# Patient Record
Sex: Female | Born: 1970 | Race: White | Hispanic: No | Marital: Single | Smoking: Never smoker
Health system: Southern US, Community
[De-identification: ages and names within clinical notes are randomized; demographics above are authoritative.]

## PROBLEM LIST (undated history)

## (undated) DIAGNOSIS — E669 Obesity, unspecified: Secondary | ICD-10-CM

## (undated) DIAGNOSIS — B009 Herpesviral infection, unspecified: Secondary | ICD-10-CM

## (undated) DIAGNOSIS — I499 Cardiac arrhythmia, unspecified: Secondary | ICD-10-CM

## (undated) DIAGNOSIS — M545 Low back pain, unspecified: Secondary | ICD-10-CM

## (undated) DIAGNOSIS — L709 Acne, unspecified: Secondary | ICD-10-CM

## (undated) DIAGNOSIS — F430 Acute stress reaction: Secondary | ICD-10-CM

## (undated) DIAGNOSIS — R768 Other specified abnormal immunological findings in serum: Secondary | ICD-10-CM

## (undated) HISTORY — DX: Low back pain: M54.5

## (undated) HISTORY — DX: Low back pain, unspecified: M54.50

## (undated) HISTORY — DX: Herpesviral infection, unspecified: B00.9

## (undated) HISTORY — DX: Acne, unspecified: L70.9

## (undated) HISTORY — DX: Other specified abnormal immunological findings in serum: R76.8

## (undated) HISTORY — DX: Acute stress reaction: F43.0

## (undated) HISTORY — DX: Obesity, unspecified: E66.9

---

## 2000-10-11 ENCOUNTER — Other Ambulatory Visit: Admission: RE | Admit: 2000-10-11 | Discharge: 2000-10-11 | Payer: Self-pay | Admitting: Obstetrics and Gynecology

## 2001-12-25 ENCOUNTER — Other Ambulatory Visit: Admission: RE | Admit: 2001-12-25 | Discharge: 2001-12-25 | Payer: Self-pay | Admitting: Obstetrics and Gynecology

## 2002-12-26 ENCOUNTER — Other Ambulatory Visit: Admission: RE | Admit: 2002-12-26 | Discharge: 2002-12-26 | Payer: Self-pay | Admitting: Obstetrics and Gynecology

## 2002-12-27 ENCOUNTER — Other Ambulatory Visit: Admission: RE | Admit: 2002-12-27 | Discharge: 2002-12-27 | Payer: Self-pay | Admitting: Obstetrics and Gynecology

## 2004-02-13 ENCOUNTER — Other Ambulatory Visit: Admission: RE | Admit: 2004-02-13 | Discharge: 2004-02-13 | Payer: Self-pay | Admitting: Obstetrics and Gynecology

## 2005-03-15 ENCOUNTER — Other Ambulatory Visit: Admission: RE | Admit: 2005-03-15 | Discharge: 2005-03-15 | Payer: Self-pay | Admitting: Obstetrics and Gynecology

## 2008-01-10 ENCOUNTER — Emergency Department (HOSPITAL_COMMUNITY): Admission: EM | Admit: 2008-01-10 | Discharge: 2008-01-10 | Payer: Self-pay | Admitting: Family Medicine

## 2008-06-17 ENCOUNTER — Ambulatory Visit (HOSPITAL_COMMUNITY): Admission: RE | Admit: 2008-06-17 | Discharge: 2008-06-17 | Payer: Self-pay | Admitting: Obstetrics and Gynecology

## 2009-12-29 HISTORY — PX: TONSILLECTOMY: SUR1361

## 2010-01-04 ENCOUNTER — Ambulatory Visit (HOSPITAL_BASED_OUTPATIENT_CLINIC_OR_DEPARTMENT_OTHER): Admission: RE | Admit: 2010-01-04 | Discharge: 2010-01-05 | Payer: Self-pay | Admitting: Otolaryngology

## 2010-07-20 ENCOUNTER — Encounter: Payer: Self-pay | Admitting: Cardiology

## 2010-09-14 ENCOUNTER — Ambulatory Visit: Payer: Self-pay | Admitting: Cardiology

## 2010-09-14 DIAGNOSIS — E663 Overweight: Secondary | ICD-10-CM | POA: Insufficient documentation

## 2010-09-14 DIAGNOSIS — R002 Palpitations: Secondary | ICD-10-CM

## 2010-09-14 DIAGNOSIS — I10 Essential (primary) hypertension: Secondary | ICD-10-CM | POA: Insufficient documentation

## 2010-09-24 ENCOUNTER — Encounter: Payer: Self-pay | Admitting: Cardiology

## 2010-09-24 ENCOUNTER — Ambulatory Visit (HOSPITAL_COMMUNITY): Admission: RE | Admit: 2010-09-24 | Discharge: 2010-09-24 | Payer: Self-pay | Admitting: Cardiology

## 2010-09-24 ENCOUNTER — Ambulatory Visit: Payer: Self-pay | Admitting: Cardiology

## 2010-09-24 ENCOUNTER — Ambulatory Visit: Payer: Self-pay

## 2010-10-13 ENCOUNTER — Telehealth: Payer: Self-pay | Admitting: Cardiology

## 2010-11-02 ENCOUNTER — Encounter: Payer: Self-pay | Admitting: Cardiology

## 2010-11-04 ENCOUNTER — Ambulatory Visit: Payer: Self-pay | Admitting: Cardiology

## 2010-11-04 ENCOUNTER — Encounter: Payer: Self-pay | Admitting: Cardiology

## 2010-11-04 DIAGNOSIS — R0609 Other forms of dyspnea: Secondary | ICD-10-CM

## 2010-11-04 DIAGNOSIS — R9389 Abnormal findings on diagnostic imaging of other specified body structures: Secondary | ICD-10-CM

## 2010-11-04 DIAGNOSIS — R0989 Other specified symptoms and signs involving the circulatory and respiratory systems: Secondary | ICD-10-CM | POA: Insufficient documentation

## 2010-11-16 ENCOUNTER — Ambulatory Visit: Payer: Self-pay | Admitting: Physician Assistant

## 2010-11-16 ENCOUNTER — Ambulatory Visit: Payer: Self-pay

## 2010-12-30 NOTE — Consult Note (Signed)
Summary: Urgent Medical Referral Form   Urgent Medical Referral Form   Imported By: Roderic Ovens 09/30/2010 11:31:36  _____________________________________________________________________  External Attachment:    Type:   Image     Comment:   External Document

## 2010-12-30 NOTE — Procedures (Signed)
Summary: summary report  summary report   Imported By: Mirna Mires 10/15/2010 11:31:32  _____________________________________________________________________  External Attachment:    Type:   Image     Comment:   External Document

## 2010-12-30 NOTE — Miscellaneous (Signed)
  Clinical Lists Changes  Observations: Added new observation of ECHOINTERP: - Left ventricle: The cavity size was normal. Wall thickness was       normal. Systolic function was normal. The estimated ejection       fraction was in the range of 50% to 55%. Wall motion was normal;       there were no regional wall motion abnormalities. Left ventricular       diastolic function parameters were normal.     - Left atrium: The atrium was mildly to moderately dilated.     - Right atrium: The atrium was mildly dilated. (09/24/2010 11:16) Added new observation of HOLTERFIND: NSR Frequent PVC's with bigemeny (09/24/2010 11:15)      Holter Monitor  Procedure date:  09/24/2010  Findings:      NSR Frequent PVC's with bigemeny  Echocardiogram  Procedure date:  09/24/2010  Findings:      - Left ventricle: The cavity size was normal. Wall thickness was       normal. Systolic function was normal. The estimated ejection       fraction was in the range of 50% to 55%. Wall motion was normal;       there were no regional wall motion abnormalities. Left ventricular       diastolic function parameters were normal.     - Left atrium: The atrium was mildly to moderately dilated.     - Right atrium: The atrium was mildly dilated.

## 2010-12-30 NOTE — Progress Notes (Signed)
Summary: pt wants echo results  Phone Note Call from Patient   Caller: Patient [phone]  Reason for Call: Talk to Nurse Summary of Call: pt wants echo results Initial call taken by: Glynda Jaeger,  October 13, 2010 4:33 PM  Follow-up for Phone Call        Phone Call Completed PER PT AWARE HOLTER NSR WITH FREQUENT PVC WITH BIGEMINEY PT ASKING IF OKAY TO GIVE BLOOD BASED A UPON ECHO AND HOLTER RESULTS. THIS WAS REASON FOR TESTING IRREG HEART DENIED ON GIVING BLOOD. Follow-up by: Scherrie Bateman, LPN,  October 13, 2010 4:55 PM     Appended Document: pt wants echo results We have tried to get in touch with the patient multiple times to arrange follow up.

## 2010-12-30 NOTE — Assessment & Plan Note (Signed)
Summary: rov per dr hochrein/pam   Visit Type:  Follow-up Primary Hannah [name]:  C. Leotis Shames, PA-C  CC:  PVCs.  History of Present Illness: The she presents for followup. She was having frequent PVCs and a Holter monitor demonstrated 11% of her beats to be premature ventricular contractions. She actually has not been feeling these recently. She's had no presyncope or syncope. She's had no chest pressure, neck or arm discomfort. His shortness of breath, PND or orthopnea. I did send her for an echocardiogram which demonstrated the EF to be about 50-55% with some mild to moderate left atrial enlargement. There were no valvular abnormalities.   Current Medications (verified): 1)  Citalopram Hydrobromide 20 Mg Tabs (Citalopram Hydrobromide) .Marland Kitchen.. 1 By Mouth Daily 2)  Levora 0.15/30 (28) 0.15-30 Mg-Mcg Tabs (Levonorgestrel-Ethinyl Estrad) .... As Directed  Allergies (verified): 1)  ! Sulfa  Past History:  Past Medical History: Last updated: 09/14/2010 Borderline HTN  Past Surgical History: Reviewed history from 09/14/2010 and no changes required. Tonsilectomy  Review of Systems       Snoring, dry mouth. Otherwise as stated in the history of present illness negative for all other systems.  Vital Signs:  Patient profile:   40 year old female Height:      68 inches Weight:      247 pounds BMI:     37.69 Pulse rate:   68 / minute Resp:     16 per minute BP sitting:   122 / 78  (right arm)  Vitals Entered By: Marrion Coy, CNA (November 04, 2010 1:57 PM)  Physical Exam  General:  Well developed, well nourished, in no acute distress. Head:  normocephalic and atraumatic Eyes:  PERRLA/EOM intact; conjunctiva and lids normal. Mouth:  Teeth, gums and palate normal. Oral mucosa normal. Neck:  Neck supple, no JVD. No masses, thyromegaly or abnormal cervical nodes. Lungs:  Clear bilaterally to auscultation and percussion. Abdomen:  Bowel sounds positive; abdomen soft and non-tender  without masses, organomegaly, or hernias noted. No hepatosplenomegaly. Msk:  Back normal, normal gait. Muscle strength and tone normal. Extremities:  No clubbing or cyanosis. Neurologic:  Alert and oriented x 3. Skin:  Intact without lesions or rashes. Cervical Nodes:  no significant adenopathy Inguinal Nodes:  no significant adenopathy Psych:  Normal affect.   Detailed Cardiovascular Exam  Neck    Carotids: Carotids full and equal bilaterally without bruits.      Neck Veins: Normal, no JVD.    Heart    Inspection: no deformities or lifts noted.      Palpation: normal PMI with no thrills palpable.      Auscultation: regular rate and rhythm, S1, S2 without murmurs, rubs, gallops, or clicks, positive ectopy  Vascular    Abdominal Aorta: no palpable masses, pulsations, or audible bruits.      Femoral Pulses: normal femoral pulses bilaterally.      Pedal Pulses: normal pedal pulses bilaterally.      Radial Pulses: normal radial pulses bilaterally.      Peripheral Circulation: no clubbing, cyanosis, or edema noted with normal capillary refill.     EKG  Procedure date:  11/04/2010  Findings:      Sinus rhythm, rate 72, axis within normal limits, intervals within normal limits, no acute ST-T wave changes.  Impression & Recommendations:  Problem # 1:  PALPITATIONS (ICD-785.1)  Patient has frequent PVCs. In going to have her come back for an exercise treadmill test to make sure there is no inducible  arrhythmia. If this is normal followup will be in one year.  Orders: Treadmill (Treadmill) EKG w/ Interpretation (93000)  Problem # 2:  ECHOCARDIOGRAM, ABNORMAL (ICD-793.2) She has an abnormal echocardiogram and I would like to repeat this in one year.  Problem # 3:  OVERWEIGHT (ICD-278.02) We discussed the [employer].  Problem # 4:  SNORING (ICD-786.09) She may have sleep apnea. We discussed this. She would like to concentrate on weight loss before considering testing or  CPAP.  Patient Instructions: 1)  Your physician recommends that you schedule a follow-up appointment at the time of your treadmill 2)  Your physician recommends that you continue on your current medications as directed. Please refer to the Current Medication list given to you today. 3)  Your physician has requested that you have an echocardiogram in 1 year.  Echocardiography is a painless test that uses sound waves to create images of your heart. It provides your doctor with information about the size and shape of your heart and how well your heart's chambers and valves are working.  This procedure takes approximately one hour. There are no restrictions for this procedure. 4)  Your physician has requested that you have an exercise tolerance test.  For further information please visit [email or web address]  Please also follow instruction sheet, as given.

## 2010-12-30 NOTE — Assessment & Plan Note (Signed)
Summary: NP6/DX:ABNORMAL EKG/LG   Visit Type:  Initial Consult Primary Provider:  C. Leotis Shames, PA-C  CC:  palpitations.  History of Present Illness: The patient presents for evaluation of palpitations. She was noted to have this when she presented recently to donate blood. She was told she could not donate. She later was found to have this on an EKG at the urgent care. She had an EKG demonstrating PACs with aberrant conduction. The patient is he'll need frequently. She may have had 3 episodes of a skipped beat that she describes a sensation of a butterfly plating in her chest. She has not had any presyncope or syncope. She has had some increased fatigue and some lack of motivation to exercise. She's had some stress and irritability that has been treated with antidepressants and improved. She thinks her fatigue may be improved with this as well. She was exercising routinely and recently did a 5K run.  She has not had any chest pressure, neck or arm discomfort. She hasn't had any shortness of breath, PND or orthopnea.  Current Medications (verified): 1)  Citalopram Hydrobromide 20 Mg Tabs (Citalopram Hydrobromide) .Marland Kitchen.. 1 By Mouth Daily 2)  Levora 0.15/30 (28) 0.15-30 Mg-Mcg Tabs (Levonorgestrel-Ethinyl Estrad) .... As Directed  Allergies (verified): 1)  ! Sulfa  Past History:  Past Medical History: Borderline HTN  Past Surgical History: Tonsilectomy  Family History:  Cancer, diabetes, hypertension   Social History: Non-smoker,non-drinker, nodrug abuse.  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  Review of Systems       As stated in the HPI and negative for all other systems.   Vital Signs:  Patient profile:   40 year old female Height:      68 inches Weight:      238 pounds BMI:     36.32 Pulse rate:   64 / minute Resp:     18 per minute BP sitting:   154 / 100  (right arm)  Vitals Entered By: Marrion Coy, CNA (September 14, 2010 11:29 AM)  Physical Exam  General:  Well  developed, well nourished, in no acute distress. Head:  normocephalic and atraumatic Eyes:  PERRLA/EOM intact; conjunctiva and lids normal. Mouth:  Teeth, gums and palate normal. Oral mucosa normal. Neck:  Neck supple, no JVD. No masses, thyromegaly or abnormal cervical nodes. Chest Wall:  no deformities or breast masses noted Lungs:  Clear bilaterally to auscultation and percussion.   Detailed Cardiovascular Exam  Neck    Carotids: Carotids full and equal bilaterally without bruits.      Neck Veins: Normal, no JVD.    Heart    Inspection: no deformities or lifts noted.      Palpation: normal PMI with no thrills palpable.      Auscultation: regular rate and rhythm, S1, S2 without murmurs, rubs, gallops, or clicks, positive ectopy  Vascular    Abdominal Aorta: no palpable masses, pulsations, or audible bruits.      Femoral Pulses: normal femoral pulses bilaterally.      Pedal Pulses: normal pedal pulses bilaterally.      Radial Pulses: normal radial pulses bilaterally.      Peripheral Circulation: no clubbing, cyanosis, or edema noted with normal capillary refill.     EKG  Procedure date:  07/20/2010  Findings:      Sinus rhythm, premature contractions, axis within normal limits intervals within normal limits,  Impression & Recommendations:  Problem # 1:  PALPITATIONS (ICD-785.1) The patient has  been doing since  putting up with She is doing okaysymptomatically was frequent on examination. I'm going to check a TSH on her 24-hour Holter and echo. Further treatment and evaluation will be based on these results. Orders: TLB-TSH (Thyroid Stimulating Hormone) (84443-TSH) Echocardiogram (Echo) Holter (Holter)  Problem # 2:  OVERWEIGHT (ICD-278.02) She understands the need to lose weight with diet and exercise. We discussed ways of motivation to encourage her to get back to her previous exercise regimen.  Problem # 3:  ESSENTIAL HYPERTENSION, BENIGN (ICD-401.1) Her blood  pressure is slightly elevated. However, I would prefer to treat this with weight loss rather than considering medications at this time.  Patient Instructions: 1)  Your physician recommends that you schedule a follow-up appointment as needed 2)  Your physician recommends that you continue on your current medications as directed. Please refer to the Current Medication list given to you today. 3)  Your physician has requested that you have an echocardiogram.  Echocardiography is a painless test that uses sound waves to create images of your heart. It provides your doctor with information about the size and shape of your heart and how well your heart's chambers and valves are working.  This procedure takes approximately one hour. There are no restrictions for this procedure. 4)  Your physician has recommended that you wear a holter monitor.  Holter monitors are medical devices that record the heart's electrical activity. Doctors most often use these monitors to diagnose arrhythmias. Arrhythmias are problems with the speed or rhythm of the heartbeat. The monitor is a small, portable device. You can wear one while you do your normal daily activities. This is usually used to diagnose what is causing palpitations/syncope (passing out).

## 2010-12-30 NOTE — Procedures (Signed)
Summary: Summary Report  Summary Report   Imported By: Erle Crocker 10/22/2010 09:04:12  _____________________________________________________________________  External Attachment:    Type:   Image     Comment:   External Document

## 2011-02-16 LAB — POCT HEMOGLOBIN-HEMACUE: Hemoglobin: 15.3 g/dL — ABNORMAL HIGH (ref 12.0–15.0)

## 2011-06-14 ENCOUNTER — Other Ambulatory Visit (HOSPITAL_COMMUNITY): Payer: Self-pay | Admitting: Obstetrics and Gynecology

## 2011-06-14 DIAGNOSIS — Z1231 Encounter for screening mammogram for malignant neoplasm of breast: Secondary | ICD-10-CM

## 2011-08-08 ENCOUNTER — Ambulatory Visit (HOSPITAL_COMMUNITY)
Admission: RE | Admit: 2011-08-08 | Discharge: 2011-08-08 | Disposition: A | Discharge status: Home / Self Care | Payer: 59 | Source: Ambulatory Visit | Attending: Obstetrics and Gynecology | Admitting: Obstetrics and Gynecology

## 2011-08-08 DIAGNOSIS — Z1231 Encounter for screening mammogram for malignant neoplasm of breast: Secondary | ICD-10-CM | POA: Insufficient documentation

## 2011-08-19 LAB — POCT RAPID STREP A: Streptococcus, Group A Screen (Direct): NEGATIVE

## 2011-11-01 ENCOUNTER — Other Ambulatory Visit: Payer: Self-pay | Admitting: Gastroenterology

## 2011-12-07 ENCOUNTER — Encounter (HOSPITAL_COMMUNITY): Payer: Self-pay | Admitting: Pharmacist

## 2011-12-12 ENCOUNTER — Encounter (HOSPITAL_COMMUNITY)
Admission: RE | Admit: 2011-12-12 | Discharge: 2011-12-12 | Disposition: A | Discharge status: Home / Self Care | Payer: 59 | Source: Ambulatory Visit | Attending: Obstetrics and Gynecology | Admitting: Obstetrics and Gynecology

## 2011-12-12 ENCOUNTER — Encounter (HOSPITAL_COMMUNITY): Payer: Self-pay

## 2011-12-12 HISTORY — DX: Cardiac arrhythmia, unspecified: I49.9

## 2011-12-12 LAB — CBC
Hemoglobin: 14 g/dL (ref 12.0–15.0)
RBC: 4.48 MIL/uL (ref 3.87–5.11)

## 2011-12-12 NOTE — Patient Instructions (Addendum)
[address] [facility]  12/12/2011   Your procedure is scheduled on:  1/18  Enter through the Main Entrance of [facility] at 6 AM.  Pick up the phone at the desk and dial [phone].   Call this number if you have problems the morning of surgery: [phone]   Remember:   Do not eat food:After Midnight.  Do not drink clear liquids: After Midnight.  Take these medicines the morning of surgery with A SIP OF WATER: NA   Do not wear jewelry, make-up or nail polish.  Do not wear lotions, powders, or perfumes. You may wear deodorant.  Do not shave 48 hours prior to surgery.  Do not bring valuables to the hospital.  Contacts, dentures or bridgework may not be worn into surgery.  Leave suitcase in the car. After surgery it may be brought to your room.  For patients admitted to the hospital, checkout time is 11:00 AM the day of discharge.   Patients discharged the day of surgery will not be allowed to drive home.  Name and phone number of your driver: Jacelyn Grip  [phone]  Special Instructions: CHG Shower Use Special Wash: 1/2 bottle night before surgery and 1/2 bottle morning of surgery.   Please read over the following fact sheets that you were given: Surgical Site Infection Prevention

## 2011-12-13 NOTE — H&P (Signed)
  Patient name   Delaynee Alred DICTATION#  [id number] CSN# [id number]  Juluis Mire, MD 12/13/2011 5:52 AM

## 2011-12-13 NOTE — H&P (Signed)
NAMERANIKA, MCNIEL                  ACCOUNT NO.:  [id number]  MEDICAL RECORD NO.:  [id number]  LOCATION:  [facility]                           FACILITY:  [facility]  PHYSICIAN:  Juluis Mire, M.D.   DATE OF BIRTH:  1971/07/10  DATE OF ADMISSION:  12/12/2011 DATE OF DISCHARGE:  12/12/2011                             HISTORY & PHYSICAL   The patient is a 41 year old nulligravida female, presents for midurethral sling using a transobturator approach.  The patient has been having trouble with worsening stress incontinence.  She leaks urine when she coughs, sneezes, and with certain physical activity.  She did undergo a urodynamic testing in the office.  Her residual urine was 80 mL.  She did reveal stress incontinence with normal leak point pressures, had a normal urethral pressure profile.  No obstructive voiding pattern.  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  Alternatives have been discussed including physical therapy versus surgical management.  The patient plans to proceed with the above-noted surgery.  Of note, she plans no children in the future.  In terms of allergies, the patient is allergic to SULFA.  Medications include: 1. Birth control pills. 2. Celapram 20 mg per day.  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  PAST SURGICAL HISTORY:  Tonsillectomy.  FAMILY HISTORY:  Significant for history of breast cancer and hypertension.  SOCIAL HISTORY:  No tobacco and occasional alcohol use.  REVIEW OF SYSTEMS:  Noncontributory.  PHYSICAL EXAMINATION:  VITAL SIGNS:  The patient is afebrile with stable vital signs. HEENT:  The patient normocephalic.  Pupils equal, round, reactive to light and accommodation.  Extraocular movements were intact.  Sclerae and conjunctivae clear.  Oropharynx clear. NECK:  Without thyromegaly. BREASTS:  Not examined. LUNGS:  Clear. CARDIOVASCULAR SYSTEM:  Regular rhythm, rate  without murmurs or gallops. ABDOMEN:  Benign. PELVIC:  Normal. GENITALIA:  Normal external genitalia.  Vaginal mucosa was clear.  Mild cystourethrocele.  Cervix unremarkable.  Uterus normal size, shape, and contour.  Adnexa free of mass or tenderness. EXTREMITIES:  Trace edema. NEUROLOGIC:  Grossly within normal limits.  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  PLAN:  The patient will undergo a midurethral sling using a transobturator approach as well as cystoscopy.  The nature of the procedure and risks have been discussed.  Risks would include a risk of infection.  Risk of hemorrhage that could require transfusion with the risk of AIDS or hepatitis.  Risk of injury to adjacent organs including bladder, urethra, or ureters that could require further exploratory surgery.  Obturator nerve injury can lead to chronic leg pain and weakness.  There is a risk of forming conditions consistent with overactive bladder, requiring medical suppression.  There is the risk of over-tightening, requiring obstructive voiding pattern, requiring loosening of the sling that could have return of incontinence.  Finally, there is a risk of mesh erosion or rejection.  This can lead to further surgical management and dyspareunia.  Also, the risk of deep venous thrombosis and pulmonary embolus.  The patient expressed understanding of the indications, risks, and alternatives.     Jonny Ruiz  Lisbeth Ply, M.D.     JSM/MEDQ  D:  12/13/2011  T:  12/13/2011  Job:  [id number]

## 2011-12-16 ENCOUNTER — Encounter (HOSPITAL_COMMUNITY): Payer: Self-pay | Admitting: *Deleted

## 2011-12-16 ENCOUNTER — Encounter (HOSPITAL_COMMUNITY)
Admission: RE | Disposition: A | Discharge status: Home / Self Care | Payer: Self-pay | Source: Ambulatory Visit | Attending: Obstetrics and Gynecology

## 2011-12-16 ENCOUNTER — Ambulatory Visit (HOSPITAL_COMMUNITY): Payer: 59 | Admitting: Anesthesiology

## 2011-12-16 ENCOUNTER — Ambulatory Visit (HOSPITAL_COMMUNITY)
Admission: RE | Admit: 2011-12-16 | Discharge: 2011-12-16 | Disposition: A | Discharge status: Home / Self Care | Payer: 59 | Source: Ambulatory Visit | Attending: Obstetrics and Gynecology | Admitting: Obstetrics and Gynecology

## 2011-12-16 ENCOUNTER — Encounter (HOSPITAL_COMMUNITY): Payer: Self-pay | Admitting: Anesthesiology

## 2011-12-16 DIAGNOSIS — R0609 Other forms of dyspnea: Secondary | ICD-10-CM

## 2011-12-16 DIAGNOSIS — R9389 Abnormal findings on diagnostic imaging of other specified body structures: Secondary | ICD-10-CM

## 2011-12-16 DIAGNOSIS — R002 Palpitations: Secondary | ICD-10-CM

## 2011-12-16 DIAGNOSIS — N393 Stress incontinence (female) (male): Secondary | ICD-10-CM | POA: Insufficient documentation

## 2011-12-16 DIAGNOSIS — Z01818 Encounter for other preprocedural examination: Secondary | ICD-10-CM | POA: Insufficient documentation

## 2011-12-16 DIAGNOSIS — Z01812 Encounter for preprocedural laboratory examination: Secondary | ICD-10-CM | POA: Insufficient documentation

## 2011-12-16 DIAGNOSIS — E663 Overweight: Secondary | ICD-10-CM

## 2011-12-16 DIAGNOSIS — I10 Essential (primary) hypertension: Secondary | ICD-10-CM

## 2011-12-16 HISTORY — PX: BLADDER SUSPENSION: SHX72

## 2011-12-16 HISTORY — PX: CYSTOSCOPY: SHX5120

## 2011-12-16 LAB — HCG, SERUM, QUALITATIVE: Preg, Serum: NEGATIVE

## 2011-12-16 SURGERY — TRANSVAGINAL TAPE (TVT) PROCEDURE
Anesthesia: General | Wound class: Clean Contaminated

## 2011-12-16 MED ORDER — LIDOCAINE-EPINEPHRINE 1 %-1:100000 IJ SOLN
INTRAMUSCULAR | Status: DC | PRN
  Administered 2011-12-16: 13 mL

## 2011-12-16 MED ORDER — PROPOFOL 10 MG/ML IV EMUL
INTRAVENOUS | Status: DC | PRN
  Administered 2011-12-16: 180 mg via INTRAVENOUS

## 2011-12-16 MED ORDER — PROMETHAZINE HCL 25 MG/ML IJ SOLN: 6.2500 mg | INTRAMUSCULAR | Status: DC | PRN

## 2011-12-16 MED ORDER — ONDANSETRON HCL 4 MG/2ML IJ SOLN
INTRAMUSCULAR | Status: DC | PRN
  Administered 2011-12-16: 4 mg via INTRAVENOUS

## 2011-12-16 MED ORDER — ONDANSETRON HCL 4 MG/2ML IJ SOLN
INTRAMUSCULAR | Status: AC
  Filled 2011-12-16: qty 2

## 2011-12-16 MED ORDER — CEFAZOLIN SODIUM 1-5 GM-% IV SOLN
1.0000 g | INTRAVENOUS | Status: AC
  Administered 2011-12-16: 1 g via INTRAVENOUS

## 2011-12-16 MED ORDER — PROPOFOL 10 MG/ML IV EMUL
INTRAVENOUS | Status: AC
  Filled 2011-12-16: qty 20

## 2011-12-16 MED ORDER — LACTATED RINGERS IV SOLN
INTRAVENOUS | Status: DC
  Administered 2011-12-16 (×2): via INTRAVENOUS

## 2011-12-16 MED ORDER — FENTANYL CITRATE 0.05 MG/ML IJ SOLN
INTRAMUSCULAR | Status: DC | PRN
Start: 2011-12-16 — End: 2011-12-16
  Administered 2011-12-16 (×3): 50 ug via INTRAVENOUS

## 2011-12-16 MED ORDER — DEXAMETHASONE SODIUM PHOSPHATE 10 MG/ML IJ SOLN
INTRAMUSCULAR | Status: AC
  Filled 2011-12-16: qty 1

## 2011-12-16 MED ORDER — FENTANYL CITRATE 0.05 MG/ML IJ SOLN: 25.0000 ug | INTRAMUSCULAR | Status: DC | PRN

## 2011-12-16 MED ORDER — CEFAZOLIN SODIUM 1-5 GM-% IV SOLN
INTRAVENOUS | Status: AC
  Filled 2011-12-16: qty 50

## 2011-12-16 MED ORDER — FENTANYL CITRATE 0.05 MG/ML IJ SOLN
INTRAMUSCULAR | Status: AC
  Filled 2011-12-16: qty 5

## 2011-12-16 MED ORDER — KETOROLAC TROMETHAMINE 30 MG/ML IJ SOLN
INTRAMUSCULAR | Status: AC
  Filled 2011-12-16: qty 1

## 2011-12-16 MED ORDER — MIDAZOLAM HCL 2 MG/2ML IJ SOLN
INTRAMUSCULAR | Status: AC
  Filled 2011-12-16: qty 2

## 2011-12-16 MED ORDER — LIDOCAINE HCL (CARDIAC) 20 MG/ML IV SOLN
INTRAVENOUS | Status: DC | PRN
  Administered 2011-12-16: 50 mg via INTRAVENOUS

## 2011-12-16 MED ORDER — KETOROLAC TROMETHAMINE 30 MG/ML IJ SOLN: 15.0000 mg | Freq: Once | INTRAMUSCULAR | Status: DC | PRN

## 2011-12-16 MED ORDER — ACETAMINOPHEN 325 MG PO TABS: 325.0000 mg | ORAL_TABLET | ORAL | Status: DC | PRN

## 2011-12-16 MED ORDER — DEXAMETHASONE SODIUM PHOSPHATE 4 MG/ML IJ SOLN
INTRAMUSCULAR | Status: DC | PRN
  Administered 2011-12-16: 5 mg via INTRAVENOUS

## 2011-12-16 MED ORDER — KETOROLAC TROMETHAMINE 30 MG/ML IJ SOLN
INTRAMUSCULAR | Status: DC | PRN
  Administered 2011-12-16: 30 mg via INTRAVENOUS

## 2011-12-16 MED ORDER — STERILE WATER FOR IRRIGATION IR SOLN
Status: DC | PRN
  Administered 2011-12-16: 200 mL

## 2011-12-16 MED ORDER — LIDOCAINE HCL (CARDIAC) 20 MG/ML IV SOLN
INTRAVENOUS | Status: AC
  Filled 2011-12-16: qty 5

## 2011-12-16 MED ORDER — MIDAZOLAM HCL 5 MG/5ML IJ SOLN
INTRAMUSCULAR | Status: DC | PRN
  Administered 2011-12-16: 2 mg via INTRAVENOUS

## 2011-12-16 SURGICAL SUPPLY — 22 items
BLADE SURG 15 STRL LF C SS BP (BLADE) ×1 IMPLANT
BLADE SURG 15 STRL SS (BLADE) ×1
CANISTER SUCTION 2500CC (MISCELLANEOUS) ×2 IMPLANT
CLOTH BEACON ORANGE TIMEOUT ST (SAFETY) ×2 IMPLANT
DERMABOND ADVANCED (GAUZE/BANDAGES/DRESSINGS) ×1
DERMABOND ADVANCED .7 DNX12 (GAUZE/BANDAGES/DRESSINGS) ×1 IMPLANT
DRAPE CAMERA CLOSED 9X96 (DRAPES) IMPLANT
DRAPE HYSTEROSCOPY (DRAPE) ×2 IMPLANT
GLOVE BIO SURGEON STRL SZ7 (GLOVE) ×4 IMPLANT
GLOVE BIOGEL PI IND STRL 6 (GLOVE) ×1 IMPLANT
GLOVE BIOGEL PI INDICATOR 6 (GLOVE) ×1
GLOVE INDICATOR 6.0 STRL GRN (GLOVE) ×2 IMPLANT
GOWN PREVENTION PLUS LG XLONG (DISPOSABLE) ×6 IMPLANT
NEEDLE HYPO 22GX1.5 SAFETY (NEEDLE) ×2 IMPLANT
NS IRRIG 1000ML POUR BTL (IV SOLUTION) ×2 IMPLANT
PACK VAGINAL WOMENS (CUSTOM PROCEDURE TRAY) ×2 IMPLANT
SET CYSTO W/LG BORE CLAMP LF (SET/KITS/TRAYS/PACK) ×2 IMPLANT
SLING HALO OBTRYX (Sling) ×2 IMPLANT
SUT VIC AB 2-0 UR6 27 (SUTURE) ×2 IMPLANT
TOWEL OR 17X24 6PK STRL BLUE (TOWEL DISPOSABLE) ×4 IMPLANT
TRAY FOLEY CATH 14FR (SET/KITS/TRAYS/PACK) ×2 IMPLANT
WATER STERILE IRR 1000ML POUR (IV SOLUTION) ×2 IMPLANT

## 2011-12-16 NOTE — Op Note (Signed)
Hannah [name], Hannah [name]                  ACCOUNT NO.:  [id number]  MEDICAL RECORD NO.:  [id number]  LOCATION:  [facility]                          FACILITY:  [facility]  PHYSICIAN:  Juluis Mire, M.D.   DATE OF BIRTH:  1971-10-11  DATE OF PROCEDURE:  12/16/2011 DATE OF DISCHARGE:                              OPERATIVE REPORT   PREOPERATIVE DIAGNOSIS:  Anatomical stress urinary incontinence.  POSTOPERATIVE DIAGNOSIS:  Anatomical stress urinary incontinence.  OPERATIVE PROCEDURE:  Mid urethral sling using a transobturator approach.  Cystoscopy.  SURGEON:  Juluis Mire, M.D.  ANESTHESIA:  General.  ESTIMATED BLOOD LOSS:  Minimal.  PACKS AND DRAINS:  Urethral Foley.  INTRAOPERATIVE BLOOD PLACED:  None.  COMPLICATIONS:  None.  INDICATION:  Dictated in history and physical.  PROCEDURE:  The patient was taken to OR and placed in supine position. After satisfactory level of general anesthesia was obtained, the patient was placed in the dorsal lithotomy position using the Allen stirrups. Perineum and vagina prepped out with Betadine and draped as sterile field.  Foley was inserted and inflated and clamped off.  The mid urethral area was identified, injected with 1% Xylocaine With Epinephrine.  Two areas on the groin were identified at the level of the clitoris lateral to the inferior pubic ramus and below the abductus longus muscle.  These areas were marked and infiltrated with 1% Xylocaine With Epinephrine.  Two punch incisions were made at these sites.  A vaginal incision was made in the mid urethral area.  Using sharp and blunt dissection, we dissected out laterally to the obturator foramen on each side.  The transobturator system was brought in place. Both needles were passed through the skin around the inferior pubic ramus and out to the vaginal incision.  There was no buttonholing of the vaginal mucosa.  Cystoscopy was performed.  No evidence of injury to the bladder or urethra.  Both  ureteral orifices were identified and noted to have streams of clear urine.  The mesh was brought in place, hooked to both needles, and brought out through the skin.  The Millan tab was clipped and removed.  We adjusted the mesh in the mid urethral area until it lay flat but we could easily rotate a Kelly 90 degrees.  The plastic sleeves were then removed as we kept a Tresa Endo in place.  We revisualized the mesh.  It was again lying flat but we could easily rotate a Kelly 90 degrees.  The arms of the mesh were trimmed at the level of skin.  The vaginal mucosa was closed in a running suture of 2-0 Vicryl.  The skin incisions were closed with Dermabond. The patient was taken out of the dorsal lithotomy position once alert and extubated, transferred to recovery in good condition.  Sponge, instrument, and needle count was correct by circulating nurse x2.     Juluis Mire, M.D.     JSM/MEDQ  D:  12/16/2011  T:  12/16/2011  Job:  [id number]

## 2011-12-16 NOTE — Progress Notes (Signed)
Patient ID: Hannah [name], female   DOB: 05-Nov-1971, 41 y.o.   MRN: [id number] Patient name DICTATION#  [id number] CSN# [id number]  Juluis Mire, MD 12/16/2011 7:53 AM

## 2011-12-16 NOTE — Anesthesia Preprocedure Evaluation (Signed)
Anesthesia Evaluation  Patient identified by MRN, date of birth, ID band Patient awake    Reviewed: Allergy & Precautions, H&P , Patient's Chart, lab work & pertinent test results, reviewed documented beta blocker date and time   History of Anesthesia Complications Negative for: history of anesthetic complications  Airway Mallampati: IV TM Distance: >3 FB Neck ROM: full    Dental No notable dental hx.    Pulmonary neg pulmonary ROS,  clear to auscultation  Pulmonary exam normal       Cardiovascular Exercise Tolerance: Good hypertension, neg cardio ROS + dysrhythmias regular Normal    Neuro/Psych Negative Neurological ROS  Negative Psych ROS   GI/Hepatic negative GI ROS, Neg liver ROS,   Endo/Other  Negative Endocrine ROS  Renal/GU negative Renal ROS     Musculoskeletal   Abdominal   Peds  Hematology negative hematology ROS (+)   Anesthesia Other Findings L atrial enlargement- no other valvular lesions 11% PVCs being followed annually by cardiology  Reproductive/Obstetrics negative OB ROS                           Anesthesia Physical Anesthesia Plan  ASA: II  Anesthesia Plan: General LMA   Post-op Pain Management:    Induction:   Airway Management Planned:   Additional Equipment:   Intra-op Plan:   Post-operative Plan:   Informed Consent: I have reviewed the patients History and Physical, chart, labs and discussed the procedure including the risks, benefits and alternatives for the proposed anesthesia with the patient or authorized representative who has indicated his/her understanding and acceptance.   Dental Advisory Given  Plan Discussed with: CRNA, Surgeon and Anesthesiologist  Anesthesia Plan Comments:         Anesthesia Quick Evaluation

## 2011-12-16 NOTE — Transfer of Care (Signed)
Immediate Anesthesia Transfer of Care Note  Patient: Hannah [name]  Procedure(s) Performed:  TRANSVAGINAL TAPE (TVT) PROCEDURE - TRANSOBTURATOR; CYSTOSCOPY  Patient Location: PACU  Anesthesia Type: General  Level of Consciousness: awake, alert  and oriented  Airway & Oxygen Therapy: Patient Spontanous Breathing and Patient connected to nasal cannula oxygen  Post-op Assessment: Report given to PACU RN  Post vital signs: Reviewed and stable Filed Vitals:   12/16/11 0604  BP: 132/79  Pulse:   Temp:   Resp:     Complications: No apparent anesthesia complications

## 2011-12-16 NOTE — Progress Notes (Signed)
4098- 350cc ns instilled into bladder via cathetor .foley cath balloon deflated ,cathetor dc'd. Pt assisted to restroom. 0940 pt voided 275cc clear yellow urine without difficulty. Dr Arelia Sneddon notified. Instructed to dc home.

## 2011-12-16 NOTE — Brief Op Note (Signed)
12/16/2011  7:52 AM  PATIENT:  Hannah [name]  41 y.o. female  PRE-OPERATIVE DIAGNOSIS:  STRESS URINARY INCONTINENCE  POST-OPERATIVE DIAGNOSIS:  STRESS URINARY INCONTINENCE  PROCEDURE:  Procedure(s): TRANSVAGINAL TAPE (TVT) PROCEDURE CYSTOSCOPY  SURGEON:  Surgeon(s): Juluis Mire, MD  PHYSICIAN ASSISTANT:   ASSISTANTS: none   ANESTHESIA:   local and general  EBL:  Total I/O In: 1000 [I.V.:1000] Out: -   BLOOD ADMINISTERED:none  DRAINS: Urinary Catheter (Foley)   LOCAL MEDICATIONS USED:  XYLOCAINE 16CC  SPECIMEN:  No Specimen  DISPOSITION OF SPECIMEN:  N/A  COUNTS:  YES  TOURNIQUET:  * No tourniquets in log *  DICTATION: .Other Dictation: Dictation Number [id number]  PLAN OF CARE: Discharge to home after PACU  PATIENT DISPOSITION:  PACU - hemodynamically stable.   Delay start of Pharmacological VTE agent (>24hrs) due to surgical blood loss or risk of bleeding:  {YES/NO/NOT APPLICABLE:20182

## 2011-12-16 NOTE — H&P (Signed)
  History and physical exam unchanged 

## 2011-12-19 ENCOUNTER — Encounter (HOSPITAL_COMMUNITY): Payer: Self-pay | Admitting: Obstetrics and Gynecology

## 2011-12-19 NOTE — Anesthesia Postprocedure Evaluation (Signed)
  Anesthesia Post-op Note  Patient: Hannah [name] [facility]  Procedure(s) Performed:  TRANSVAGINAL TAPE (TVT) PROCEDURE - TRANSOBTURATOR; CYSTOSCOPY  Patient is awake and responsive. Pain and nausea are reasonably well controlled. Vital signs are stable and clinically acceptable. Oxygen saturation is clinically acceptable. There are no apparent anesthetic complications at this time. Patient is ready for discharge.

## 2012-03-02 ENCOUNTER — Other Ambulatory Visit: Payer: Self-pay | Admitting: Physician Assistant

## 2012-03-02 MED ORDER — VALACYCLOVIR HCL 1 G PO TABS: ORAL_TABLET | ORAL | Status: DC

## 2012-03-02 NOTE — Telephone Encounter (Signed)
Done

## 2012-03-22 ENCOUNTER — Ambulatory Visit (INDEPENDENT_AMBULATORY_CARE_PROVIDER_SITE_OTHER): Payer: 59 | Admitting: Physician Assistant

## 2012-03-22 VITALS — BP 133/89 | HR 79 | Temp 98.2°F | Resp 16 | Ht 68.0 in | Wt 257.0 lb

## 2012-03-22 DIAGNOSIS — R05 Cough: Secondary | ICD-10-CM

## 2012-03-22 DIAGNOSIS — J019 Acute sinusitis, unspecified: Secondary | ICD-10-CM

## 2012-03-22 DIAGNOSIS — J301 Allergic rhinitis due to pollen: Secondary | ICD-10-CM

## 2012-03-22 MED ORDER — CEFDINIR 300 MG PO CAPS: 300.0000 mg | ORAL_CAPSULE | Freq: Two times a day (BID) | ORAL | Status: AC

## 2012-03-22 MED ORDER — PREDNISONE 20 MG PO TABS: ORAL_TABLET | ORAL | Status: AC

## 2012-03-22 MED ORDER — FLUTICASONE PROPIONATE 50 MCG/ACT NA SUSP: 2.0000 | Freq: Every day | NASAL | Status: DC

## 2012-03-22 MED ORDER — FLUCONAZOLE 150 MG PO TABS: 150.0000 mg | ORAL_TABLET | Freq: Once | ORAL | Status: AC

## 2012-03-22 MED ORDER — HYDROCODONE-HOMATROPINE 5-1.5 MG/5ML PO SYRP: ORAL_SOLUTION | ORAL | Status: AC

## 2012-03-22 NOTE — Progress Notes (Signed)
Patient ID: Philisha Weinel [facility] MRN: [id number], DOB: 02/26/1971, 41 y.o. Date of Encounter: 03/22/2012, 5:50 PM  Primary Physician: No primary provider on file.  Chief Complaint:  Chief Complaint  Patient presents with  . Sinus Problem    and Allergies    HPI: 41 y.o. year old female presents with a 7 day history of nasal congestion, post nasal drip, sore throat, sinus pressure, and cough. Afebrile. No chills. Nasal congestion thick and green/yellow. Sinus pressure is the worst symptom. Cough is productive secondary to post nasal drip and not associated with time of day. Ears feel full, leading to sensation of muffled hearing. Has tried OTC cold preps without success. No GI complaints. Appetite normal. She also complains of watery itchy eyes, although these are improving. These are typical allergy symptoms for her.  No recent antibiotics, recent travels, or sick contacts   No leg trauma, sedentary periods, h/o cancer, or tobacco use.  Past Medical History  Diagnosis Date  . Dysrhythmia     F/U with Dr Antoine Poche yearly     Home Meds: Prior to Admission medications   Medication Sig Start Date End Date Taking? Authorizing Provider  cetirizine (ZYRTEC) 10 MG tablet Take 10 mg by mouth daily.   Yes Historical Provider, MD  citalopram (CELEXA) 20 MG tablet Take 20 mg by mouth daily.   Yes Historical Provider, MD  levonorgestrel-ethinyl estradiol (LEVORA 0.15/30, 28,) 0.15-30 MG-MCG tablet Take 1 tablet by mouth daily.   Yes Historical Provider, MD  Multiple Vitamins-Minerals (MULTIVITAMINS THER. W/MINERALS) TABS Take 1 tablet by mouth daily.   Yes Historical Provider, MD  valACYclovir (VALTREX) 1000 MG tablet Pt to use 1/2 po bid for 3 days for outbreak 03/02/12  Yes Sarah Harvie Bridge, PA-C                guaiFENesin (MUCINEX) 600 MG 12 hr tablet Take 600 mg by mouth as needed. For sinus symptoms.    Historical Provider, MD                pseudoephedrine (SUDAFED) 30 MG tablet Take 30 mg by  mouth every 6 (six) hours as needed. For nasal symptoms.    Historical Provider, MD    Allergies:  Allergies  Allergen Reactions  . Sulfonamide Derivatives Rash    History   Social History  . Marital Status: Single    Spouse Name: N/A    Number of Children: N/A  . Years of Education: N/A   Occupational History  . Not on file.   Social History Main Topics  . Smoking status: Never Smoker   . Smokeless tobacco: Not on file  . Alcohol Use: Yes     occasionally  . Drug Use: No  . Sexually Active:    Other Topics Concern  . Not on file   Social History Narrative  . No narrative on file     Review of Systems: Constitutional: negative for chills, fever, night sweats or weight changes Cardiovascular: negative for chest pain or palpitations Respiratory: negative for hemoptysis, wheezing, or shortness of breath Abdominal: negative for abdominal pain, nausea, vomiting or diarrhea Dermatological: negative for rash Neurologic: negative for headache   Physical Exam: Blood pressure 133/89, pulse 79, temperature 98.2 F (36.8 C), resp. rate 16, height 5\' 8"  (1.727 m), weight 257 lb (116.574 kg), last menstrual period 02/23/2012., Body mass index is 39.08 kg/(m^2). General: Well developed, well nourished, in no acute distress. Head: Normocephalic, atraumatic, eyes without discharge, sclera  non-icteric, nares are congested. Bilateral auditory canals clear, TM's are without perforation, pearly grey with reflective cone of light bilaterally. Serous effusion bilaterally behind TM's. Maxillary sinus TTP. Oral cavity moist, dentition normal. Posterior pharynx with post nasal drip and mild erythema. No peritonsillar abscess or tonsillar exudate. Neck: Supple. No thyromegaly. Full ROM. No lymphadenopathy. Lungs: Clear bilaterally to auscultation without wheezes, rales, or rhonchi. Breathing is unlabored.  Heart: RRR with S1 S2. No murmurs, rubs, or gallops appreciated. Msk:  Strength and  tone normal for age. Extremities: No clubbing or cyanosis. No edema. Neuro: Alert and oriented X 3. Moves all extremities spontaneously. CNII-XII grossly in tact. Psych:  Responds to questions appropriately with a normal affect.     ASSESSMENT AND PLAN:  41 y.o. year old female with sinusitis and allergic rhinitis -Omnicef 300 mg 1 po bid #20 no RF  -Hycodan #4oz 1 tsp po q 4-6 hours prn cough no RF SED -Prednisone 20 mg #12 3x2, 2x2, 1x2 no RF -Flonase 2 sprays each nare daily #1 RF 6 -Mucinex -Tylenol/Motrin prn -Rest/fluids -RTC precautions -RTC 3-5 days if no improvement  Signed, Eula Listen, PA-C 03/22/2012 5:50 PM

## 2012-05-27 ENCOUNTER — Ambulatory Visit (INDEPENDENT_AMBULATORY_CARE_PROVIDER_SITE_OTHER): Payer: 59 | Admitting: Physician Assistant

## 2012-05-27 VITALS — BP 134/93 | HR 101 | Temp 98.6°F | Resp 22 | Ht 68.0 in | Wt 247.2 lb

## 2012-05-27 DIAGNOSIS — L0390 Cellulitis, unspecified: Secondary | ICD-10-CM

## 2012-05-27 DIAGNOSIS — L0291 Cutaneous abscess, unspecified: Secondary | ICD-10-CM

## 2012-05-27 DIAGNOSIS — M79646 Pain in unspecified finger(s): Secondary | ICD-10-CM

## 2012-05-27 DIAGNOSIS — M79609 Pain in unspecified limb: Secondary | ICD-10-CM

## 2012-05-27 MED ORDER — HYDROCODONE-ACETAMINOPHEN 5-325 MG PO TABS: 1.0000 | ORAL_TABLET | Freq: Four times a day (QID) | ORAL | Status: AC | PRN

## 2012-05-27 MED ORDER — DOXYCYCLINE HYCLATE 100 MG PO CAPS: 100.0000 mg | ORAL_CAPSULE | Freq: Two times a day (BID) | ORAL | Status: AC

## 2012-05-27 NOTE — Progress Notes (Signed)
  Subjective:    Patient ID: Hannah [name], female    DOB: 1971/04/26, 41 y.o.   MRN: [id number]  HPI Presents with a very sore lesion on the tip of the right index finger x 2 weeks.  Her manicurist identified a bump there as a periungual wart.  It had been present for years, only tender when she accidentally bumped it.  The manicurist recommended treatment with OTC Compound W, which she used.  The area became raw and painful, throbbing.  The last application of Compound W was last night.  She wants the area "cut off."   Review of Systems No fever, chills, but feels anxious and sweaty.    Objective:   Physical Exam  Vitals reviewed. Constitutional: She appears well-developed and well-nourished. She is active.  Cardiovascular: Normal rate.   Pulmonary/Chest: Effort normal.  Musculoskeletal: Normal range of motion. She exhibits tenderness. She exhibits no edema.       Right hand: She exhibits normal capillary refill. normal sensation noted. Normal strength noted.       Hands: Skin: Skin is warm. Lesion noted. No rash noted. She is diaphoretic. No erythema. No pallor.       granulomatous-looking lesion at the tip of the right index finger.   Psychiatric: She has a normal mood and affect.          Assessment & Plan:   1. Cellulitis  doxycycline (VIBRAMYCIN) 100 MG capsule  2. Pain in finger  HYDROcodone-acetaminophen (NORCO) 5-325 MG per tablet  Re-evaluate in 3 days.  Anticipate I&D or paring of proud flesh at that point.  Patient Instructions  Stop using the Compound W.  Wash the area at least daily with soap and water.  Use ibuprofen, up to 800 mg three times daily with food) as well.   Seen with Dr. Cleta Alberts.

## 2012-05-27 NOTE — Patient Instructions (Signed)
Stop using the Compound W.  Wash the area at least daily with soap and water.  Use ibuprofen, up to 800 mg three times daily with food) as well.

## 2012-05-30 ENCOUNTER — Ambulatory Visit (INDEPENDENT_AMBULATORY_CARE_PROVIDER_SITE_OTHER): Payer: 59 | Admitting: Emergency Medicine

## 2012-05-30 ENCOUNTER — Ambulatory Visit: Payer: 59

## 2012-05-30 VITALS — BP 124/86 | HR 65 | Temp 99.1°F | Resp 16 | Ht 67.5 in | Wt 248.0 lb

## 2012-05-30 DIAGNOSIS — T3695XA Adverse effect of unspecified systemic antibiotic, initial encounter: Secondary | ICD-10-CM

## 2012-05-30 DIAGNOSIS — M79646 Pain in unspecified finger(s): Secondary | ICD-10-CM

## 2012-05-30 DIAGNOSIS — L989 Disorder of the skin and subcutaneous tissue, unspecified: Secondary | ICD-10-CM

## 2012-05-30 DIAGNOSIS — M79609 Pain in unspecified limb: Secondary | ICD-10-CM

## 2012-05-30 MED ORDER — FLUCONAZOLE 150 MG PO TABS: 150.0000 mg | ORAL_TABLET | Freq: Once | ORAL | Status: AC

## 2012-05-30 MED ORDER — HYDROCODONE-ACETAMINOPHEN 5-325 MG PO TABS: 1.0000 | ORAL_TABLET | Freq: Four times a day (QID) | ORAL | Status: AC | PRN

## 2012-05-30 NOTE — Patient Instructions (Addendum)
We have referred you to a hand surgeon for additional evaluation and treatment for the lesion on the tip of your finger.  Change the dressing daily, and wash with soap and water.  Continue the antibiotic.

## 2012-05-30 NOTE — Progress Notes (Signed)
  Subjective:    Patient ID: Hannah [name], female    DOB: May 28, 1971, 41 y.o.   MRN: [id number]  HPI Presents for re-evaluation of a painful lesion at the tip of her finger.  Was initially evaluated 05/27/2012.  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  After a recent manicure, she identified the lesion as a subungual wart and treated it with OTC Compound W.  2 days later it began VERY tender and she had reduced ROM.    On evaluation, the lesion appeared to be proud flesh, with possible wart underneath.  Despite lack of swelling and erythema, she was treated for probable cellulitis with doxycycline.  Today she returns with [employer] less pain and now has the ability to move the finger.   She hopes to have the proud flesh removed.  Reports she routinely develops a yeast infection after antibiotic use and requests diflucan. Review of Systems     Objective:   Physical Exam VS noted.  A&Ox3. Right index finger tender only at the very tip where the tissue is red and friable.  It is surrounded by hyperkeratotic tissue consistent with cryotherapy and apparent wart tissue.  Unable to pare due to pain, so after VCO metacarpal block provided with 4 cc of 2% lidocaine plain.  A 15 blade was used to pare the lesion, but I was unable to remove it in it's entirely.  Dr. Cleta Alberts then used a curette, but was also unable to find the bottom of the lesion.  The procedure was terminated.  Tissue removed by curette sent for pathology.  Xeroform gauze and dressing applied.  Right Index Finger: [facility] reading (PRIMARY) by  Dr. Cleta Alberts.  Boney growth from the distal phalanx.  ?Endchondroma.       Assessment & Plan:   1. Lesion of finger  DG Finger Index Right, Dermatology pathology, Ambulatory referral to Hand Surgery  2. Pain in finger  HYDROcodone-acetaminophen (NORCO) 5-325 MG per tablet  3. Adverse effect of antibiotic  fluconazole (DIFLUCAN) 150 MG tablet    ?Endchondroma? Continue antibiotics.

## 2012-06-25 ENCOUNTER — Other Ambulatory Visit: Payer: Self-pay | Admitting: Physician Assistant

## 2012-07-05 ENCOUNTER — Other Ambulatory Visit (HOSPITAL_COMMUNITY): Payer: Self-pay | Admitting: Obstetrics and Gynecology

## 2012-07-05 DIAGNOSIS — Z1231 Encounter for screening mammogram for malignant neoplasm of breast: Secondary | ICD-10-CM

## 2012-08-06 ENCOUNTER — Encounter (HOSPITAL_BASED_OUTPATIENT_CLINIC_OR_DEPARTMENT_OTHER): Payer: Self-pay | Admitting: *Deleted

## 2012-08-06 NOTE — Progress Notes (Signed)
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 Pt denies sleep apnea-not tested, did not go back 2012 for any cardiac follow up,states no problems. She did fine with surgery 1.13. Reviewed with dr frederick-since this is a small surgery under mac,ok. If were a bigger surgery-would need cardiac follow up.

## 2012-08-08 NOTE — H&P (Signed)
Hannah [name] is an 41 y.o. female.   Chief Complaint: SYMPTOMATIC MASS RIGHT INDEX FINGER HPI: PT FOLLOWED IN OFFICE CONCERNED ABOUT MASS TO FINGER  WOULD LIKE FOR MASS TO BE REMOVED HERE FOR SURGERY TODAY TO REMOVE MASS  Past Medical History  Diagnosis Date  . LBP (low back pain)   . Acne   . Obesity   . Stress reaction   . HSV-2 seropositive   . HSV-1 infection   . Dysrhythmia     F/U with Dr Antoine Poche yearly    Past Surgical History  Procedure Date  . Tonsillectomy 12/2009  . Bladder suspension 12/16/2011    Procedure: TRANSVAGINAL TAPE (TVT) PROCEDURE;  Surgeon: Juluis Mire, MD;  Location: [facility];  Service: Gynecology;  Laterality: N/A;  TRANSOBTURATOR  . Cystoscopy 12/16/2011    Procedure: CYSTOSCOPY;  Surgeon: Juluis Mire, MD;  Location: [facility];  Service: Gynecology;  Laterality: N/A;    No family history on file. Social History:  reports that she has never smoked. She does not have any smokeless tobacco history on file. She reports that she drinks alcohol. She reports that she does not use illicit drugs.  Allergies:  Allergies  Allergen Reactions  . Sulfonamide Derivatives Rash    No prescriptions prior to admission    No results found for this or any previous visit (from the past 48 hour(s)). No results found.  NO RECENT ILLNESSES OR HOSPITALIZATIONS  Height 5' 7.5" (1.715 m), weight 112.492 kg (248 lb). General Appearance:  Alert, cooperative, no distress, appears stated age  Head:  Normocephalic, without obvious abnormality, atraumatic  Eyes:  Pupils equal, conjunctiva/corneas clear,         Throat: Lips, mucosa, and tongue normal; teeth and gums normal  Neck: No visible masses     Lungs:   respirations unlabored  Chest Wall:  No tenderness or deformity  Heart:  Regular rate and rhythm,  Abdomen:   Soft, non-tender,         Extremities:   Pulses: 2+ and symmetric  Skin: Skin color, texture, turgor normal, no rashes or lesions       Neurologic: Normal    Assessment/Plan RIGHT INDEX FINGER MASS  RIGHT INDEX FINGER MASS EXCISION  R/B/A DISCUSSED WITH PT IN OFFICE.  PT VOICED UNDERSTANDING OF PLAN CONSENT SIGNED DAY OF SURGERY PT SEEN AND EXAMINED PRIOR TO OPERATIVE PROCEDURE/DAY OF SURGERY SITE MARKED. QUESTIONS ANSWERED WILL GO HOME FOLLOWING SURGERY  Sharma Covert 08/09/2012

## 2012-08-09 ENCOUNTER — Ambulatory Visit (HOSPITAL_BASED_OUTPATIENT_CLINIC_OR_DEPARTMENT_OTHER): Payer: 59 | Admitting: Anesthesiology

## 2012-08-09 ENCOUNTER — Ambulatory Visit (HOSPITAL_BASED_OUTPATIENT_CLINIC_OR_DEPARTMENT_OTHER)
Admission: RE | Admit: 2012-08-09 | Discharge: 2012-08-09 | Disposition: A | Discharge status: Home / Self Care | Payer: 59 | Source: Ambulatory Visit | Attending: Orthopedic Surgery | Admitting: Orthopedic Surgery

## 2012-08-09 ENCOUNTER — Ambulatory Visit (HOSPITAL_COMMUNITY)
Admission: RE | Admit: 2012-08-09 | Discharge: 2012-08-09 | Disposition: A | Discharge status: Home / Self Care | Payer: 59 | Source: Ambulatory Visit | Attending: Obstetrics and Gynecology | Admitting: Obstetrics and Gynecology

## 2012-08-09 ENCOUNTER — Encounter (HOSPITAL_BASED_OUTPATIENT_CLINIC_OR_DEPARTMENT_OTHER): Payer: Self-pay | Admitting: Anesthesiology

## 2012-08-09 ENCOUNTER — Encounter (HOSPITAL_BASED_OUTPATIENT_CLINIC_OR_DEPARTMENT_OTHER)
Admission: RE | Disposition: A | Discharge status: Home / Self Care | Payer: Self-pay | Source: Ambulatory Visit | Attending: Orthopedic Surgery

## 2012-08-09 ENCOUNTER — Encounter (HOSPITAL_BASED_OUTPATIENT_CLINIC_OR_DEPARTMENT_OTHER): Payer: Self-pay | Admitting: *Deleted

## 2012-08-09 DIAGNOSIS — E663 Overweight: Secondary | ICD-10-CM

## 2012-08-09 DIAGNOSIS — I10 Essential (primary) hypertension: Secondary | ICD-10-CM | POA: Insufficient documentation

## 2012-08-09 DIAGNOSIS — R2231 Localized swelling, mass and lump, right upper limb: Secondary | ICD-10-CM

## 2012-08-09 DIAGNOSIS — R9389 Abnormal findings on diagnostic imaging of other specified body structures: Secondary | ICD-10-CM

## 2012-08-09 DIAGNOSIS — D1610 Benign neoplasm of short bones of unspecified upper limb: Secondary | ICD-10-CM | POA: Insufficient documentation

## 2012-08-09 DIAGNOSIS — R002 Palpitations: Secondary | ICD-10-CM

## 2012-08-09 DIAGNOSIS — Z1231 Encounter for screening mammogram for malignant neoplasm of breast: Secondary | ICD-10-CM | POA: Insufficient documentation

## 2012-08-09 DIAGNOSIS — R0609 Other forms of dyspnea: Secondary | ICD-10-CM

## 2012-08-09 HISTORY — PX: MASS EXCISION: SHX2000

## 2012-08-09 SURGERY — EXCISION MASS
Anesthesia: Monitor Anesthesia Care | Site: Finger | Laterality: Right | Wound class: Clean

## 2012-08-09 MED ORDER — LIDOCAINE HCL (CARDIAC) 20 MG/ML IV SOLN
INTRAVENOUS | Status: DC | PRN
  Administered 2012-08-09: 50 mg via INTRAVENOUS

## 2012-08-09 MED ORDER — HYDROMORPHONE HCL PF 1 MG/ML IJ SOLN: 0.2500 mg | INTRAMUSCULAR | Status: DC | PRN

## 2012-08-09 MED ORDER — FENTANYL CITRATE 0.05 MG/ML IJ SOLN
INTRAMUSCULAR | Status: DC | PRN
  Administered 2012-08-09 (×2): 50 ug via INTRAVENOUS

## 2012-08-09 MED ORDER — CHLORHEXIDINE GLUCONATE 4 % EX LIQD: 60.0000 mL | Freq: Once | CUTANEOUS | Status: DC

## 2012-08-09 MED ORDER — LACTATED RINGERS IV SOLN
INTRAVENOUS | Status: DC
  Administered 2012-08-09: 13:00:00 via INTRAVENOUS

## 2012-08-09 MED ORDER — CEPHALEXIN 500 MG PO CAPS: 500.0000 mg | ORAL_CAPSULE | Freq: Four times a day (QID) | ORAL | Status: AC

## 2012-08-09 MED ORDER — CEFAZOLIN SODIUM-DEXTROSE 2-3 GM-% IV SOLR: 2.0000 g | INTRAVENOUS | Status: DC

## 2012-08-09 MED ORDER — MIDAZOLAM HCL 5 MG/5ML IJ SOLN
INTRAMUSCULAR | Status: DC | PRN
  Administered 2012-08-09: 2 mg via INTRAVENOUS

## 2012-08-09 MED ORDER — OXYCODONE-ACETAMINOPHEN 5-325 MG PO TABS: 1.0000 | ORAL_TABLET | ORAL | Status: AC | PRN

## 2012-08-09 MED ORDER — OXYCODONE HCL 5 MG PO TABS: 5.0000 mg | ORAL_TABLET | Freq: Once | ORAL | Status: DC | PRN

## 2012-08-09 MED ORDER — BUPIVACAINE HCL (PF) 0.5 % IJ SOLN
INTRAMUSCULAR | Status: DC | PRN
  Administered 2012-08-09: 12 mL

## 2012-08-09 MED ORDER — DEXAMETHASONE SODIUM PHOSPHATE 4 MG/ML IJ SOLN
INTRAMUSCULAR | Status: DC | PRN
  Administered 2012-08-09: 10 mg via INTRAVENOUS

## 2012-08-09 MED ORDER — PROPOFOL INFUSION 10 MG/ML OPTIME
INTRAVENOUS | Status: DC | PRN
  Administered 2012-08-09: 100 ug/kg/min via INTRAVENOUS

## 2012-08-09 MED ORDER — OXYCODONE HCL 5 MG/5ML PO SOLN: 5.0000 mg | Freq: Once | ORAL | Status: DC | PRN

## 2012-08-09 SURGICAL SUPPLY — 48 items
BANDAGE ELASTIC 3 VELCRO ST LF (GAUZE/BANDAGES/DRESSINGS) IMPLANT
BANDAGE ELASTIC 4 VELCRO ST LF (GAUZE/BANDAGES/DRESSINGS) IMPLANT
BANDAGE GAUZE STRT 1 STR LF (GAUZE/BANDAGES/DRESSINGS) ×2 IMPLANT
BENZOIN TINCTURE PRP APPL 2/3 (GAUZE/BANDAGES/DRESSINGS) ×2 IMPLANT
BLADE SURG 15 STRL LF DISP TIS (BLADE) ×1 IMPLANT
BLADE SURG 15 STRL SS (BLADE) ×1
BNDG COHESIVE 1X5 TAN STRL LF (GAUZE/BANDAGES/DRESSINGS) ×2 IMPLANT
BNDG ESMARK 4X9 LF (GAUZE/BANDAGES/DRESSINGS) ×2 IMPLANT
BRUSH SCRUB EZ PLAIN DRY (MISCELLANEOUS) ×2 IMPLANT
CORDS BIPOLAR (ELECTRODE) ×2 IMPLANT
COVER MAYO STAND STRL (DRAPES) ×2 IMPLANT
COVER TABLE BACK 60X90 (DRAPES) ×2 IMPLANT
CUFF TOURNIQUET SINGLE 18IN (TOURNIQUET CUFF) IMPLANT
DECANTER SPIKE VIAL GLASS SM (MISCELLANEOUS) IMPLANT
DRAPE EXTREMITY T 121X128X90 (DRAPE) ×2 IMPLANT
DRAPE SURG 17X23 STRL (DRAPES) ×2 IMPLANT
DRSG EMULSION OIL 3X3 NADH (GAUZE/BANDAGES/DRESSINGS) ×2 IMPLANT
DRSG TUBE GAUZE 1X5YD SZ2 (GAUZE/BANDAGES/DRESSINGS) ×2 IMPLANT
GLOVE BIOGEL PI IND STRL 8.5 (GLOVE) ×1 IMPLANT
GLOVE BIOGEL PI INDICATOR 8.5 (GLOVE) ×1
GLOVE SURG ORTHO 8.0 STRL STRW (GLOVE) ×2 IMPLANT
GOWN PREVENTION PLUS XLARGE (GOWN DISPOSABLE) ×2 IMPLANT
GOWN PREVENTION PLUS XXLARGE (GOWN DISPOSABLE) ×2 IMPLANT
NEEDLE HYPO 25X1 1.5 SAFETY (NEEDLE) ×2 IMPLANT
NS IRRIG 1000ML POUR BTL (IV SOLUTION) ×2 IMPLANT
PACK BASIN DAY SURGERY FS (CUSTOM PROCEDURE TRAY) ×2 IMPLANT
PAD CAST 3X4 CTTN HI CHSV (CAST SUPPLIES) IMPLANT
PADDING CAST ABS 3INX4YD NS (CAST SUPPLIES)
PADDING CAST ABS 4INX4YD NS (CAST SUPPLIES) ×1
PADDING CAST ABS COTTON 3X4 (CAST SUPPLIES) IMPLANT
PADDING CAST ABS COTTON 4X4 ST (CAST SUPPLIES) ×1 IMPLANT
PADDING CAST COTTON 3X4 STRL (CAST SUPPLIES)
SPLINT PLASTER CAST XFAST 3X15 (CAST SUPPLIES) IMPLANT
SPLINT PLASTER XTRA FASTSET 3X (CAST SUPPLIES)
SPONGE GAUZE 4X4 12PLY (GAUZE/BANDAGES/DRESSINGS) ×2 IMPLANT
STOCKINETTE 4X48 STRL (DRAPES) ×2 IMPLANT
STRIP CLOSURE SKIN 1/2X4 (GAUZE/BANDAGES/DRESSINGS) IMPLANT
SUT ETHILON 5 0 P 3 18 (SUTURE) ×1
SUT MNCRL AB 3-0 PS2 18 (SUTURE) ×2 IMPLANT
SUT MNCRL AB 4-0 PS2 18 (SUTURE) IMPLANT
SUT NYLON ETHILON 5-0 P-3 1X18 (SUTURE) ×1 IMPLANT
SUT PROLENE 4 0 PS 2 18 (SUTURE) IMPLANT
SYR BULB 3OZ (MISCELLANEOUS) ×2 IMPLANT
SYR CONTROL 10ML LL (SYRINGE) ×2 IMPLANT
TOWEL OR 17X24 6PK STRL BLUE (TOWEL DISPOSABLE) ×2 IMPLANT
TRAY DSU PREP LF (CUSTOM PROCEDURE TRAY) ×2 IMPLANT
UNDERPAD 30X30 INCONTINENT (UNDERPADS AND DIAPERS) ×2 IMPLANT
WATER STERILE IRR 1000ML POUR (IV SOLUTION) IMPLANT

## 2012-08-09 NOTE — Brief Op Note (Signed)
08/09/2012  1:15 PM  PATIENT:  Hannah [name]  41 y.o. female  PRE-OPERATIVE DIAGNOSIS:  right index finger bony exostosis  POST-OPERATIVE DIAGNOSIS:  same  PROCEDURE:  Procedure(s) (LRB) with comments: EXCISION MASS (Right) - right bony mass excision  SURGEON:  Surgeon(s) and Role:    * Sharma Covert, MD - Primary  PHYSICIAN ASSISTANT:   ASSISTANTS: none   ANESTHESIA:   MAC  EBL:     BLOOD ADMINISTERED:0 CC PRBC  DRAINS: none   LOCAL MEDICATIONS USED:  MARCAINE     SPECIMEN:  No Specimen  DISPOSITION OF SPECIMEN:  PATHOLOGY  COUNTS:  YES  TOURNIQUET:  * Missing tourniquet times found for documented tourniquets in log:  53610 *  DICTATION: .[id number]  PLAN OF CARE: Discharge to home after PACU  PATIENT DISPOSITION:  PACU - hemodynamically stable.   Delay start of Pharmacological VTE agent (>24hrs) due to surgical blood loss or risk of bleeding: not applicable

## 2012-08-09 NOTE — Transfer of Care (Signed)
Immediate Anesthesia Transfer of Care Note  Patient: Hannah [name] [facility]  Procedure(s) Performed: Procedure(s) (LRB) with comments: EXCISION MASS (Right) - right bony mass excision  Patient Location: PACU  Anesthesia Type: MAC  Level of Consciousness: awake and alert   Airway & Oxygen Therapy: Patient Spontanous Breathing and Patient connected to face mask oxygen  Post-op Assessment: Report given to PACU RN and Post -op Vital signs reviewed and stable  Post vital signs: Reviewed and stable  Complications: No apparent anesthesia complications

## 2012-08-09 NOTE — Anesthesia Preprocedure Evaluation (Signed)
Anesthesia Evaluation  Patient identified by MRN, date of birth, ID band Patient awake    Reviewed: Allergy & Precautions, H&P , NPO status , Patient's Chart, lab work & pertinent test results  Airway Mallampati: II TM Distance: >3 FB Neck ROM: Full    Dental No notable dental hx. (+) Teeth Intact and Dental Advisory Given   Pulmonary neg pulmonary ROS,  breath sounds clear to auscultation  Pulmonary exam normal       Cardiovascular hypertension, + dysrhythmias Rhythm:Regular Rate:Normal     Neuro/Psych PSYCHIATRIC DISORDERS negative neurological ROS     GI/Hepatic negative GI ROS, Neg liver ROS,   Endo/Other  negative endocrine ROS  Renal/GU negative Renal ROS  negative genitourinary   Musculoskeletal   Abdominal   Peds  Hematology negative hematology ROS (+)   Anesthesia Other Findings   Reproductive/Obstetrics negative OB ROS                           Anesthesia Physical Anesthesia Plan  ASA: II  Anesthesia Plan: MAC   Post-op Pain Management:    Induction: Intravenous  Airway Management Planned: Simple Face Mask  Additional Equipment:   Intra-op Plan:   Post-operative Plan:   Informed Consent: I have reviewed the patients History and Physical, chart, labs and discussed the procedure including the risks, benefits and alternatives for the proposed anesthesia with the patient or authorized representative who has indicated his/her understanding and acceptance.   Dental advisory given  Plan Discussed with: CRNA  Anesthesia Plan Comments:         Anesthesia Quick Evaluation

## 2012-08-09 NOTE — Anesthesia Postprocedure Evaluation (Signed)
Anesthesia Post Note  Patient: Hannah [name] [facility]  Procedure(s) Performed: Procedure(s) (LRB): EXCISION MASS (Right)  Anesthesia type: General  Patient location: PACU  Post pain: Pain level controlled  Post assessment: Patient's Cardiovascular Status Stable  Last Vitals:  Filed Vitals:   08/09/12 1445  BP: 139/105  Pulse: 66  Temp:   Resp: 19    Post vital signs: Reviewed and stable  Level of consciousness: alert  Complications: No apparent anesthesia complications

## 2012-08-09 NOTE — Anesthesia Procedure Notes (Signed)
Procedure Name: MAC Date/Time: 08/09/2012 1:37 PM Performed by: Caren Macadam Pre-anesthesia Checklist: Patient identified, Emergency Drugs available, Suction available and Patient being monitored Patient Re-evaluated:Patient Re-evaluated prior to inductionOxygen Delivery Method: Simple face mask Intubation Type: IV induction

## 2012-08-10 ENCOUNTER — Encounter (HOSPITAL_BASED_OUTPATIENT_CLINIC_OR_DEPARTMENT_OTHER): Payer: Self-pay | Admitting: Orthopedic Surgery

## 2012-08-10 NOTE — Op Note (Signed)
[name], Hannah [name]                  ACCOUNT NO.:  [id number]  MEDICAL RECORD NO.:  [id number]  LOCATION:  [facility]                         FACILITY:  [facility]  PHYSICIAN:  Hannah Done, Hannah [name]  DATE OF BIRTH:  [birth year]  DATE OF PROCEDURE:  08/09/2012 DATE OF DISCHARGE:  08/09/2012                              OPERATIVE REPORT   PREOPERATIVE DIAGNOSIS:  Right index finger bony exostosis.  POSTOPERATIVE DIAGNOSIS:  Right index finger bony exostosis.  ATTENDING PHYSICIAN:  Hannah Done, Hannah [name] who scrubbed and present for the entire procedure.  ASSISTANT SURGEON:  None.  ANESTHESIA:  1% Xylocaine and 0.25% Marcaine local block with IV sedation.  SURGICAL INDICATIONS:  [name] is a right-hand-dominant female with worsening pain and discomfort over the right index finger.  The patient was noted to have a large increasing size bony exostosis over the distal phalanx.  The patient elected to undergo removal.  Risks, benefits, and alternatives were discussed in detail with the patient and signed informed consent was obtained.  Risks include, but not limited to bleeding, infection, damage to nearby nerves, arteries, or tendons, incomplete relief of symptoms, regrowth, and need for further surgical intervention.  SURGICAL PROCEDURE:  Right index finger bony exostectomy and bony deep mass removal.  DESCRIPTION OF PROCEDURE:  The patient was properly identified in the preop holding area, a mark with a permanent marker made on the right index finger to indicate correct operative site.  The patient was then brought back to the operating room, placed supine on the anesthesia room table where the IV sedation was administered.  Well-padded tourniquet was then placed in the right forearm and sealed with 1000 drape.  Right upper extremity was then prepped and draped in normal sterile fashion. Time-out was called, correct side was identified, and procedure then begun.  Local anesthetic had been  administered.  Attention was then turned to the index finger where a fishmouth incision was made distally over the distal phalanx.  Dissection was carried down through the skin and subcutaneous tissues down the distal phalanx where the bony exostosis was circumferentially dissected and removed with small rongeurs.  This was taken back to healthy-appearing distal phalanx with a rongeur.  The wound was then thoroughly irrigated.  Radiographs 2 views of the finger were then obtained, which did show removal of bony exostosis.  Copious wound irrigation [name] throughout and the skin was then closed with simple 5-0 nylon sutures.  Adaptic dressing, sterile compressive bandage then applied.  The patient tolerated the procedure well and returned to recovery room in good condition.  Radiographs 2 views of the finger did show removal of the bony exostosis.  POSTOPERATIVE PLAN:  The patient will be discharged home, seen back in the office in approximately 10 days for wound check, suture removal, and go over the pathology.  PATHOLOGY:  Bony exostosis to Pathology.     Hannah Done, Hannah [name]     FWO/MEDQ  D:  08/09/2012  T:  08/10/2012  Job:  [id number]

## 2012-10-29 ENCOUNTER — Other Ambulatory Visit: Payer: Self-pay | Admitting: Physician Assistant

## 2012-11-07 ENCOUNTER — Ambulatory Visit (INDEPENDENT_AMBULATORY_CARE_PROVIDER_SITE_OTHER): Payer: 59 | Admitting: Family Medicine

## 2012-11-07 ENCOUNTER — Ambulatory Visit: Payer: 59

## 2012-11-07 VITALS — BP 130/80 | HR 120 | Temp 99.9°F | Resp 18 | Ht 67.5 in | Wt 252.0 lb

## 2012-11-07 DIAGNOSIS — R6883 Chills (without fever): Secondary | ICD-10-CM

## 2012-11-07 DIAGNOSIS — R52 Pain, unspecified: Secondary | ICD-10-CM

## 2012-11-07 DIAGNOSIS — R05 Cough: Secondary | ICD-10-CM

## 2012-11-07 DIAGNOSIS — J029 Acute pharyngitis, unspecified: Secondary | ICD-10-CM

## 2012-11-07 DIAGNOSIS — R55 Syncope and collapse: Secondary | ICD-10-CM

## 2012-11-07 DIAGNOSIS — J101 Influenza due to other identified influenza virus with other respiratory manifestations: Secondary | ICD-10-CM

## 2012-11-07 DIAGNOSIS — R059 Cough, unspecified: Secondary | ICD-10-CM

## 2012-11-07 DIAGNOSIS — J111 Influenza due to unidentified influenza virus with other respiratory manifestations: Secondary | ICD-10-CM

## 2012-11-07 LAB — POCT CBC
Granulocyte percent: 82.4 %G — AB (ref 37–80)
HCT, POC: 46.1 % (ref 37.7–47.9)
Hemoglobin: 14.1 g/dL (ref 12.2–16.2)
MPV: 8.7 fL (ref 0–99.8)
POC Granulocyte: 8.2 — AB (ref 2–6.9)
POC MID %: 6.6 %M (ref 0–12)
RDW, POC: 13.5 %

## 2012-11-07 LAB — POCT RAPID STREP A (OFFICE): Rapid Strep A Screen: NEGATIVE

## 2012-11-07 LAB — GLUCOSE, POCT (MANUAL RESULT ENTRY): POC Glucose: 89 mg/dl (ref 70–99)

## 2012-11-07 LAB — POCT INFLUENZA A/B: Influenza A, POC: POSITIVE

## 2012-11-07 MED ORDER — HYDROCODONE-HOMATROPINE 5-1.5 MG/5ML PO SYRP: 5.0000 mL | ORAL_SOLUTION | ORAL | Status: DC | PRN

## 2012-11-07 MED ORDER — OSELTAMIVIR PHOSPHATE 75 MG PO CAPS: 75.0000 mg | ORAL_CAPSULE | Freq: Two times a day (BID) | ORAL | Status: DC

## 2012-11-07 NOTE — Progress Notes (Signed)
Subjective: 41 year old lady who has been ill since around Thanksgiving. Had problems with allergies and sinus-type symptoms at that time. She treated herself with OTC medications. Sunday night and Monday [place] worse. She's had a bad cough. 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 She felt awful yesterday. The cough has been terrible. She felt better this morning and went to work. Hours coughing spells at work out for a lightheaded at work. 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 She was able to sit down in a chair did not fall to the floor. She came on over here to get checked out. She has been running some fever, has had some chills. Generally he is a pretty healthy person.  Objective: Overweight white female who sounds awful. Been able to hear her coughing from up and down the hall. She is flushed and weak feeling. Almost feels a little confused from the coughing. Her TMs are normal. Throat mildly erythematous. Strep screen was taken. Neck supple without significant nodes. Chest is clear to auscultation. No rales rhonchi or wheezes heard. Heart mildly tachycardic, no murmurs. Abdomen soft. No rashes.  Assessment: Cough Near syncope Fever Status post sinusitis  Plan: She looks sick. We will check her out with a strep test because of her recent exposure, flu swab, chest x-ray, and CBC. She did have a flu shot on 09/24/2012  [facility] reading (PRIMARY) by  Dr. Alwyn Ren Mild increase of markings lower lungs .  No pneumonia  . Results for orders placed in visit on 11/07/12  POCT RAPID STREP A (OFFICE)      Component Value Range   Rapid Strep A Screen Negative  Negative  GLUCOSE, POCT (MANUAL RESULT ENTRY)      Component Value Range   POC Glucose 89  70 - 99 mg/dl  POCT INFLUENZA A/B      Component Value Range   Influenza A, POC Positive     Influenza B, POC Negative    POCT CBC      Component Value Range   WBC 10.0  4.6 - 10.2 K/uL   Lymph, poc 1.1  0.6 -  3.4   POC LYMPH PERCENT 11.0  10 - 50 %L   MID (cbc) 0.7  0 - 0.9   POC MID % 6.6  0 - 12 %M   POC Granulocyte 8.2 (*) 2 - 6.9   Granulocyte percent 82.4 (*) 37 - 80 %G   RBC 4.89  4.04 - 5.48 M/uL   Hemoglobin 14.1  12.2 - 16.2 g/dL   HCT, POC 16.1  09.6 - 47.9 %   MCV 94.3  80 - 97 fL   MCH, POC 28.8  27 - 31.2 pg   MCHC 30.6 (*) 31.8 - 35.4 g/dL   RDW, POC 04.5     Platelet Count, POC 311  142 - 424 K/uL   MPV 8.7  0 - 99.8 fL   Diagnoses: Influenza A  Plan: Treat with antiviral

## 2012-11-07 NOTE — Patient Instructions (Addendum)
Influenza, Adult Influenza ("the flu") is a viral infection of the respiratory tract. It occurs more often in winter months because people spend more time in close contact with one another. Influenza can make you feel very sick. Influenza easily spreads from person to person (contagious). CAUSES   Influenza is caused by a virus that infects the respiratory tract. You can catch the virus by breathing in droplets from an infected person's cough or sneeze. You can also catch the virus by touching something that was recently contaminated with the virus and then touching your mouth, nose, or eyes. SYMPTOMS   Symptoms typically last 4 to 10 days and may include:  Fever.   Chills.   Headache, body aches, and muscle aches.   Sore throat.   Chest discomfort and cough.   Poor appetite.   Weakness or feeling tired.   Dizziness.   Nausea or vomiting.  DIAGNOSIS   Diagnosis of influenza is often made based on your history and a physical exam. A nose or throat swab test can be done to confirm the diagnosis. RISKS AND COMPLICATIONS You may be at risk for a more severe case of influenza if you smoke cigarettes, have diabetes, have chronic heart disease (such as heart failure) or lung disease (such as asthma), or if you have a weakened immune system. Elderly people and pregnant women are also at risk for more serious infections. The most common complication of influenza is a lung infection (pneumonia). Sometimes, this complication can require emergency medical care and may be life-threatening. PREVENTION   An annual influenza vaccination (flu shot) is the best way to avoid getting influenza. An annual flu shot is now routinely recommended for all adults in the [place] TREATMENT   In mild cases, influenza goes away on its own. Treatment is directed at relieving symptoms. For more severe cases, your caregiver may prescribe antiviral medicines to shorten the sickness. Antibiotic medicines are not effective,  because the infection is caused by a virus, not by bacteria. HOME CARE INSTRUCTIONS  Only take over-the-counter or prescription medicines for pain, discomfort, or fever as directed by your caregiver.   Use a cool mist humidifier to make breathing easier.   Get plenty of rest until your temperature returns to normal. This usually takes 3 to 4 days.   Drink enough fluids to keep your urine clear or pale yellow.   Cover your mouth and nose when coughing or sneezing, and wash your hands well to avoid spreading the virus.   Stay home from work or school until your fever has been gone for at least 1 full day.  SEEK MEDICAL CARE IF:    You have chest pain or a deep cough that worsens or produces more mucus.   You have nausea, vomiting, or diarrhea.  SEEK IMMEDIATE MEDICAL CARE IF:    You have difficulty breathing, shortness of breath, or your skin or nails turn bluish.   You have severe neck pain or stiffness.   You have a severe headache, facial pain, or earache.   You have a worsening or recurring fever.   You have nausea or vomiting that cannot be controlled.  MAKE SURE YOU:  Understand these instructions.   Will watch your condition.   Will get help right away if you are not doing well or get worse.  Document Released: 11/11/2000 Document Revised: 05/15/2012 Document Reviewed: 02/13/2012 [employer] Patient Information 2013 [employer].    

## 2012-11-12 ENCOUNTER — Telehealth: Payer: Self-pay

## 2012-11-12 NOTE — Telephone Encounter (Signed)
PT WAS SEEN FOR ILLNESS AND WAS DIAGNOSED WITH FLU.  SHE WAS GIVEN A WORK NOTE, BUT IT DID NOT SPECIFY THAT SHE WAS OUT FOR FLU.  SHE WORKS FOR [employer] AND SAYS THAT IF WE CAN REWRITE HER A NOTE SAYING SHE WAS OUT FOR FLU, THEN HER FMLA WILL COVER IT.  CALL HER AT [phone] IF NEEDED.  PLEASE FAX THE NOTE TO [phone]

## 2012-11-12 NOTE — Telephone Encounter (Signed)
Faxed out

## 2012-11-22 ENCOUNTER — Ambulatory Visit (INDEPENDENT_AMBULATORY_CARE_PROVIDER_SITE_OTHER): Payer: 59 | Admitting: Physician Assistant

## 2012-11-22 VITALS — BP 124/86 | HR 86 | Temp 98.6°F | Resp 18 | Ht 69.0 in | Wt 256.0 lb

## 2012-11-22 DIAGNOSIS — R05 Cough: Secondary | ICD-10-CM

## 2012-11-22 DIAGNOSIS — J329 Chronic sinusitis, unspecified: Secondary | ICD-10-CM

## 2012-11-22 DIAGNOSIS — R059 Cough, unspecified: Secondary | ICD-10-CM

## 2012-11-22 DIAGNOSIS — J040 Acute laryngitis: Secondary | ICD-10-CM

## 2012-11-22 MED ORDER — GUAIFENESIN ER 1200 MG PO TB12: 1.0000 | ORAL_TABLET | Freq: Two times a day (BID) | ORAL | Status: AC | PRN

## 2012-11-22 MED ORDER — IPRATROPIUM BROMIDE 0.03 % NA SOLN: 2.0000 | Freq: Two times a day (BID) | NASAL | Status: AC

## 2012-11-22 MED ORDER — FLUCONAZOLE 150 MG PO TABS: 150.0000 mg | ORAL_TABLET | Freq: Once | ORAL | Status: DC

## 2012-11-22 MED ORDER — HYDROCOD POLST-CHLORPHEN POLST 10-8 MG/5ML PO LQCR: 5.0000 mL | Freq: Two times a day (BID) | ORAL | Status: AC | PRN

## 2012-11-22 MED ORDER — IPRATROPIUM BROMIDE 0.02 % IN SOLN: 0.5000 mg | Freq: Once | RESPIRATORY_TRACT | Status: DC

## 2012-11-22 MED ORDER — AMOXICILLIN-POT CLAVULANATE 875-125 MG PO TABS: 1.0000 | ORAL_TABLET | Freq: Two times a day (BID) | ORAL | Status: AC

## 2012-11-22 MED ORDER — ALBUTEROL SULFATE (2.5 MG/3ML) 0.083% IN NEBU: 2.5000 mg | INHALATION_SOLUTION | Freq: Once | RESPIRATORY_TRACT | Status: DC

## 2012-11-22 NOTE — Progress Notes (Signed)
  Subjective:    Patient ID: Hannah [name], female    DOB: Nov 06, 1971, 41 y.o.   MRN: [id number]  HPI  This 41 y.o. female presents for evaluation of persistent illness.  She's been ill since late November.  She tested positive for Influenza type A during evaluation here 11/07/2012.  She completed the Tamiflu and hydrocodone-containing cough syrup, and the severe generalized myalgias and fever have resolved.  She continues to cough, non-productive.  She describes a HA, extreme fatigue, and loss of voice.  Review of Systems As above.  No GU/GI symptoms.    Objective:   Physical Exam  Blood pressure 124/86, pulse 86, temperature 98.6 F (37 C), temperature source Oral, resp. rate 18, height 5\' 9"  (1.753 m), weight 256 lb (116.121 kg), last menstrual period 10/23/2012, SpO2 98.00%. Body mass index is 37.80 kg/(m^2). Well-developed, well nourished WF who is awake, alert and oriented, in mild distress. HEENT: [place]/AT, PERRL, EOMI.  Sclera and conjunctiva are clear.  EAC are patent, TMs are normal in appearance. Nasal mucosa is congested, with crusting, pink and moist. OP is clear. Frontal and maxillary sinus tenderness. Neck: supple, non-tender, no lymphadenopathy, thyromegaly. Heart: RRR, no murmur Lungs: normal effort, CTA. No change with Albuterol + Atrovent nebulizer treatment. Extremities: no cyanosis, clubbing or edema. Skin: warm and dry without rash. Psychologic: good mood and appropriate affect, normal speech and behavior.     Assessment & Plan:   1. Cough  albuterol (PROVENTIL) (2.5 MG/3ML) 0.083% nebulizer solution 2.5 mg, ipratropium (ATROVENT) nebulizer solution 0.5 mg, chlorpheniramine-HYDROcodone (TUSSIONEX PENNKINETIC ER) 10-8 MG/5ML LQCR  2. Laryngitis    3. Sinusitis  ipratropium (ATROVENT) 0.03 % nasal spray, Guaifenesin (MUCINEX MAXIMUM STRENGTH) 1200 MG TB12, amoxicillin-clavulanate (AUGMENTIN) 875-125 MG per tablet, fluconazole (DIFLUCAN) 150 MG tablet   Supportive  care.

## 2012-11-22 NOTE — Patient Instructions (Signed)
Get plenty of rest and drink at least 64 ounces of water daily. 

## 2013-07-02 ENCOUNTER — Telehealth: Payer: Self-pay

## 2013-07-02 NOTE — Telephone Encounter (Signed)
Pt is needing a refill on valtrex and has switched pharmacy to [facility] on [address]

## 2013-07-03 MED ORDER — VALACYCLOVIR HCL 1 G PO TABS: ORAL_TABLET | ORAL | Status: DC

## 2013-07-03 NOTE — Telephone Encounter (Signed)
Authorized.  Meds ordered this encounter  Medications  . valACYclovir (VALTREX) 1000 MG tablet    Sig: TAKE 1/2 TABLET BY MOUTH TWICE DAILY FOR 3 DAYS FOR OUTBREAK    Dispense:  30 tablet    Refill:  5

## 2013-07-03 NOTE — Telephone Encounter (Signed)
Please advise 

## 2013-08-05 ENCOUNTER — Other Ambulatory Visit (HOSPITAL_COMMUNITY): Payer: Self-pay | Admitting: Obstetrics and Gynecology

## 2013-08-05 DIAGNOSIS — Z1231 Encounter for screening mammogram for malignant neoplasm of breast: Secondary | ICD-10-CM

## 2013-08-14 ENCOUNTER — Ambulatory Visit (HOSPITAL_COMMUNITY)
Admission: RE | Admit: 2013-08-14 | Discharge: 2013-08-14 | Disposition: A | Discharge status: Home / Self Care | Payer: No Typology Code available for payment source | Source: Ambulatory Visit | Attending: Obstetrics and Gynecology | Admitting: Obstetrics and Gynecology

## 2013-08-14 DIAGNOSIS — Z1231 Encounter for screening mammogram for malignant neoplasm of breast: Secondary | ICD-10-CM | POA: Insufficient documentation

## 2014-02-09 ENCOUNTER — Ambulatory Visit (INDEPENDENT_AMBULATORY_CARE_PROVIDER_SITE_OTHER): Payer: BC Managed Care – PPO | Admitting: Emergency Medicine

## 2014-02-09 VITALS — BP 142/98 | HR 87 | Temp 98.6°F | Resp 16 | Ht 68.25 in | Wt 258.6 lb

## 2014-02-09 DIAGNOSIS — J329 Chronic sinusitis, unspecified: Secondary | ICD-10-CM

## 2014-02-09 DIAGNOSIS — H6980 Other specified disorders of Eustachian tube, unspecified ear: Secondary | ICD-10-CM

## 2014-02-09 DIAGNOSIS — J209 Acute bronchitis, unspecified: Secondary | ICD-10-CM

## 2014-02-09 MED ORDER — PROMETHAZINE-CODEINE 6.25-10 MG/5ML PO SYRP: 5.0000 mL | ORAL_SOLUTION | Freq: Four times a day (QID) | ORAL | Status: AC | PRN

## 2014-02-09 MED ORDER — AMOXICILLIN-POT CLAVULANATE 875-125 MG PO TABS: 1.0000 | ORAL_TABLET | Freq: Two times a day (BID) | ORAL | Status: AC

## 2014-02-09 MED ORDER — FLUCONAZOLE 150 MG PO TABS: 150.0000 mg | ORAL_TABLET | Freq: Once | ORAL | Status: AC

## 2014-02-09 MED ORDER — VALACYCLOVIR HCL 1 G PO TABS: ORAL_TABLET | ORAL | Status: AC

## 2014-02-09 MED ORDER — ALBUTEROL SULFATE HFA 108 (90 BASE) MCG/ACT IN AERS: 2.0000 | INHALATION_SPRAY | RESPIRATORY_TRACT | Status: AC | PRN

## 2014-02-09 NOTE — Progress Notes (Signed)
[facility] [address], [place] [address] [phone]  Date:  02/09/2014   Name:  Hannah [name] [facility]   DOB:  [birth year]   MRN:  [id number]  PCP:  No primary provider on file.    Chief Complaint: Otalgia and Cough   History of Present Illness:  Hannah [name] is a 43 y.o. very pleasant female patient who presents with the following:  Ill since last weekend with nasal congestion and a mucoid nasal drainage.  Has a persistent cough worse at night productive of a thick tenacious purulent sputum.  Some wheezing.  No shortness of breath.  No nausea or vomiting.  No stool change or rash. No sore throat.  Temp of 100.3.  No improvement with over the counter medications or other home remedies. Denies other complaint or health concern today.   Patient Active Problem List   Diagnosis Date Noted  . SNORING 11/04/2010  . ECHOCARDIOGRAM, ABNORMAL 11/04/2010  . OVERWEIGHT 09/14/2010  . ESSENTIAL HYPERTENSION, BENIGN 09/14/2010  . PALPITATIONS 09/14/2010    Past Medical History  Diagnosis Date  . LBP (low back pain)   . Acne   . Obesity   . Stress reaction   . HSV-2 seropositive   . HSV-1 infection   . Dysrhythmia     F/U with Dr Percival Spanish yearly    Past Surgical History  Procedure Laterality Date  . Tonsillectomy  12/2009  . Bladder suspension  12/16/2011    Procedure: TRANSVAGINAL TAPE (TVT) PROCEDURE;  Surgeon: Darlyn Chamber, MD;  Location: [facility];  Service: Gynecology;  Laterality: N/A;  TRANSOBTURATOR  . Cystoscopy  12/16/2011    Procedure: CYSTOSCOPY;  Surgeon: Darlyn Chamber, MD;  Location: [facility];  Service: Gynecology;  Laterality: N/A;  . Mass excision  08/09/2012    Procedure: EXCISION MASS;  Surgeon: Linna Hoff, MD;  Location: [facility];  Service: Orthopedics;  Laterality: Right;  right bony mass excision    History  Substance Use Topics  . Smoking status: Never Smoker   . Smokeless tobacco: Never Used  . Alcohol Use: No   Comment: occasionally    Family History  Problem Relation Age of Onset  . Hypertension Mother   . Cancer Father 3    pancreatic  . Cancer Paternal Grandmother     Allergies  Allergen Reactions  . Sulfonamide Derivatives Rash    Medication list has been reviewed and updated.  Current Outpatient Prescriptions on File Prior to Visit  Medication Sig Dispense Refill  . amphetamine-dextroamphetamine (ADDERALL) 20 MG tablet Take 20 mg by mouth 3 (three) times daily.      . cetirizine (ZYRTEC) 10 MG tablet Take 10 mg by mouth daily.      . chlorpheniramine-HYDROcodone (TUSSIONEX PENNKINETIC ER) 10-8 MG/5ML LQCR Take 5 mLs by mouth every 12 (twelve) hours as needed (cough).  140 mL  0  . citalopram (CELEXA) 20 MG tablet Take 20 mg by mouth daily.      . fluconazole (DIFLUCAN) 150 MG tablet Take 1 tablet (150 mg total) by mouth once. Repeat if needed  2 tablet  0  . Guaifenesin (MUCINEX MAXIMUM STRENGTH) 1200 MG TB12 Take 1 tablet (1,200 mg total) by mouth every 12 (twelve) hours as needed.  14 tablet  1  . ipratropium (ATROVENT) 0.03 % nasal spray Place 2 sprays into the nose 2 (two) times daily.  30 mL  0  . levonorgestrel-ethinyl estradiol (LEVORA 0.15/30, 28,) 0.15-30  MG-MCG tablet Take 1 tablet by mouth daily.      . Multiple Vitamins-Minerals (MULTIVITAMINS THER. W/MINERALS) TABS Take 1 tablet by mouth daily.      . valACYclovir (VALTREX) 1000 MG tablet TAKE 1/2 TABLET BY MOUTH TWICE DAILY FOR 3 DAYS FOR OUTBREAK  30 tablet  5  . amoxicillin-clavulanate (AUGMENTIN) 875-125 MG per tablet Take 1 tablet by mouth 2 (two) times daily.  20 tablet  0  . fexofenadine (ALLEGRA) 180 MG tablet Take 180 mg by mouth daily.       No current facility-administered medications on file prior to visit.    Review of Systems:  As per HPI, otherwise negative.    Physical Examination: Filed Vitals:   02/09/14 0938  BP: 142/98  Pulse: 87  Temp: 98.6 F (37 C)  Resp: 16   Filed Vitals:    02/09/14 0938  Height: 5' 8.25" (1.734 m)  Weight: 258 lb 9.6 oz (117.3 kg)   Body mass index is 39.01 kg/(m^2). Ideal Body Weight: Weight in (lb) to have BMI = 25: 165.3  GEN: WDWN, NAD, Non-toxic, A & O x 3  Persistent cough in office. HEENT: Atraumatic, Normocephalic. Neck supple. No masses, No LAD. Ears and Nose: No external deformity. CV: RRR, No M/G/R. No JVD. No thrill. No extra heart sounds. PULM: CTA B, scant diffuse wheezes, no crackles, rhonchi. No retractions. No resp. distress. No accessory muscle use. ABD: S, NT, ND, +BS. No rebound. No HSM. EXTR: No c/c/e NEURO Normal gait.  PSYCH: Normally interactive. Conversant. Not depressed or anxious appearing.  Calm demeanor.    Assessment and Plan: Bronchitis with bronchospasm Albuterol Phen c cod augmentin   Signed,  Ellison Carwin, MD

## 2014-02-09 NOTE — Patient Instructions (Signed)

## 2020-09-29 ENCOUNTER — Other Ambulatory Visit: Payer: Self-pay | Admitting: Obstetrics and Gynecology

## 2020-09-29 DIAGNOSIS — Z9189 Other specified personal risk factors, not elsewhere classified: Secondary | ICD-10-CM

## 2020-10-21 ENCOUNTER — Other Ambulatory Visit: Payer: 59

## 2020-11-14 ENCOUNTER — Ambulatory Visit
Admission: RE | Admit: 2020-11-14 | Discharge: 2020-11-14 | Disposition: A | Discharge status: Home / Self Care | Payer: BC Managed Care – PPO | Source: Ambulatory Visit | Attending: Obstetrics and Gynecology | Admitting: Obstetrics and Gynecology

## 2020-11-14 DIAGNOSIS — Z9189 Other specified personal risk factors, not elsewhere classified: Secondary | ICD-10-CM

## 2020-11-14 MED ORDER — GADOBUTROL 1 MMOL/ML IV SOLN
10.0000 mL | Freq: Once | INTRAVENOUS | Status: AC | PRN
  Administered 2020-11-14: 10 mL via INTRAVENOUS

## 2021-09-25 ENCOUNTER — Other Ambulatory Visit: Payer: Self-pay | Admitting: Obstetrics and Gynecology

## 2021-09-25 DIAGNOSIS — Z800 Family history of malignant neoplasm of digestive organs: Secondary | ICD-10-CM

## 2021-10-13 ENCOUNTER — Other Ambulatory Visit: Payer: 59

## 2022-04-04 IMAGING — MR MR BREAST BILAT WO/W CM
8 of 12 series · 33 of 48 positions shown · IV contrast (10 ML GADAVIST)
Comparison: None.

CLINICAL DATA: High risk screening MRI. Elevated lifetime risk.
Patient is currently asymptomatic.

LABS:  None.
EXAM:
BILATERAL BREAST MRI WITH AND WITHOUT CONTRAST
TECHNIQUE: Multiplanar, multisequence MR images of both breasts were obtained
prior to and following the intravenous administration of 10 ml of
Gadavist

[Series 2: t2_tirm_tra ipat (a-p) · axial · 3.0mm · 0.76mm/px · 1 of 64 slices shown]
[im 1/64]
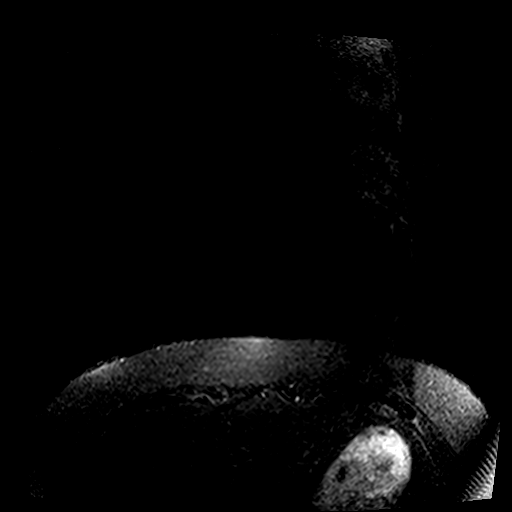

[Series 3: fl3d pre-cm no · axial · non-contrast · 1.2mm · 1.02mm/px · z∈[-102,+108]mm · 5 of 176 slices shown]
[im 1/176]
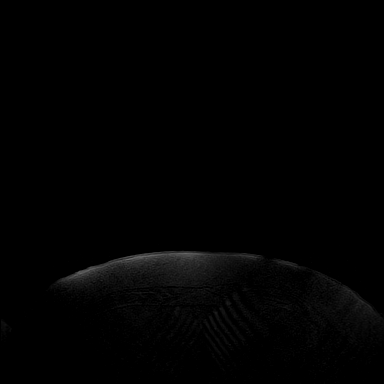
[im 44/176]
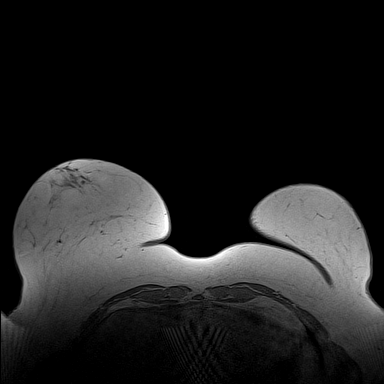
[im 88/176]
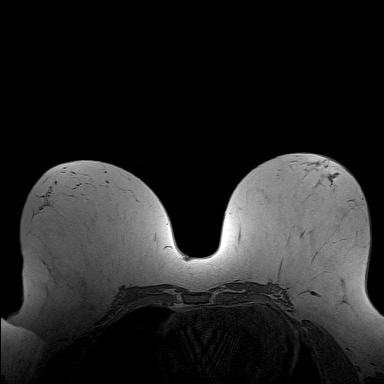
[im 132/176]
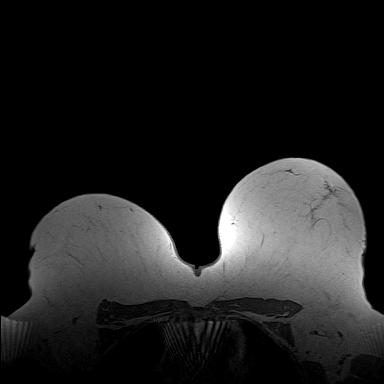
[im 176/176]
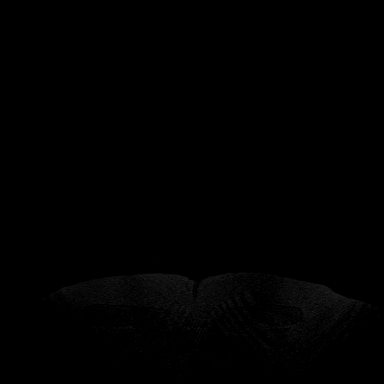

[Series 4: fl3d pre-cm · axial · non-contrast · 1.2mm · 1.02mm/px · z∈[-102,+108]mm · 5 of 176 slices shown]
[im 1/176]
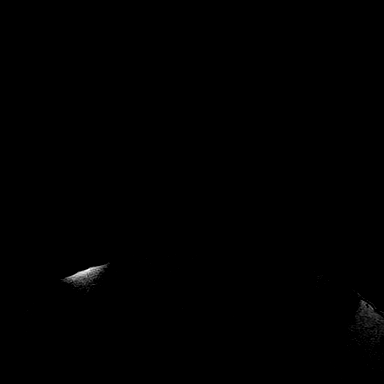
[im 44/176]
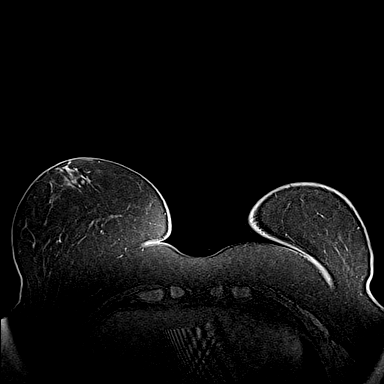
[im 88/176]
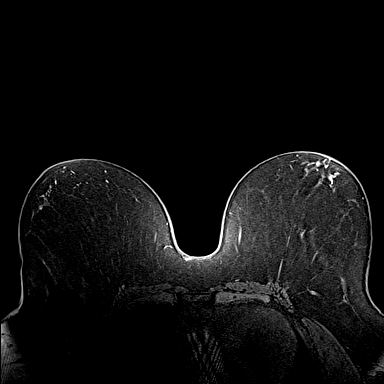
[im 132/176]
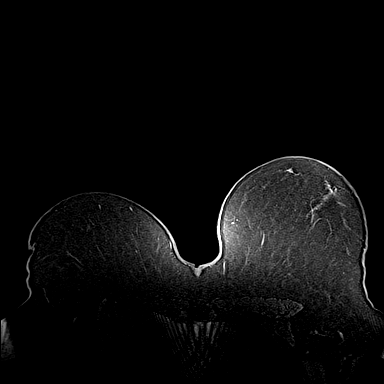
[im 176/176]
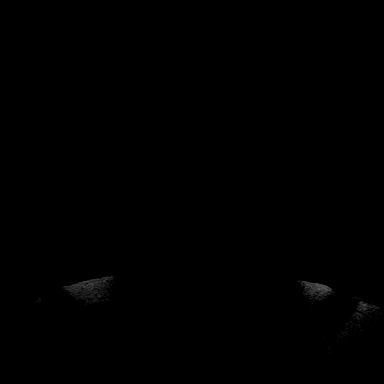

[Series 5: fl3d post-cm 20 · axial · 1.2mm · 1.02mm/px · z∈[-102,+108]mm · 5 of 176 slices shown (1 of 3)]
[im 1/176]
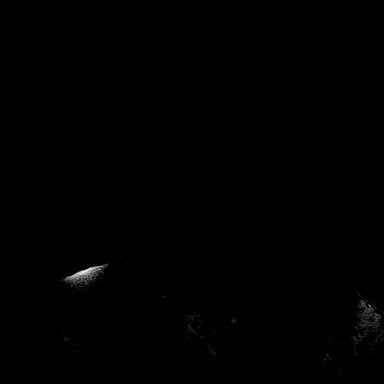
[im 44/176]
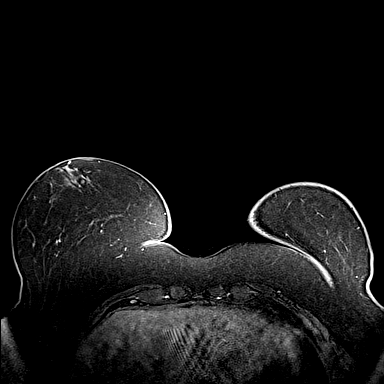
[im 88/176]
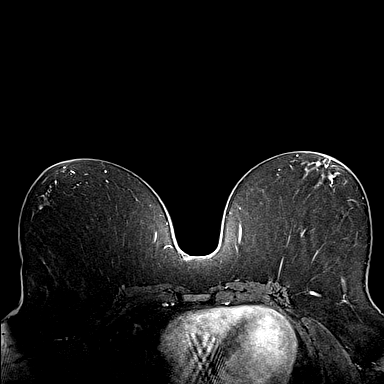
[im 132/176]
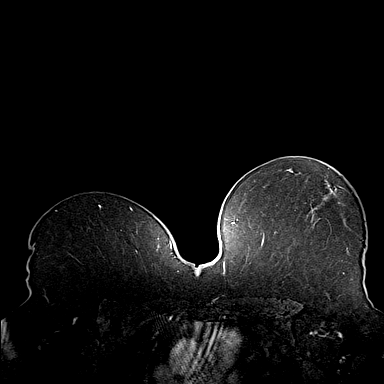
[im 176/176]
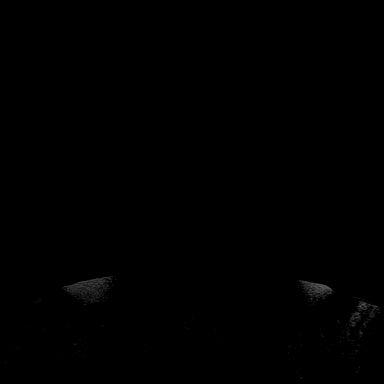

[Series 6: fl3d post-cm 20 · axial · 1.2mm · 1.02mm/px · z∈[-102,+108]mm · 5 of 176 slices shown (2 of 3)]
[im 1/176]
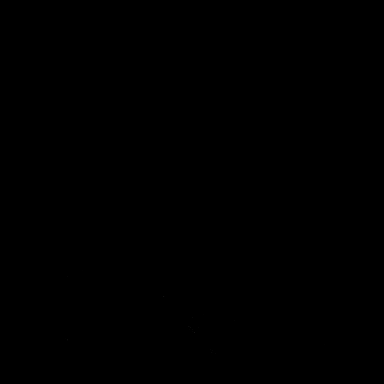
[im 44/176]
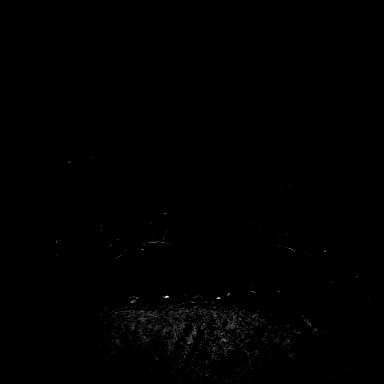
[im 88/176]
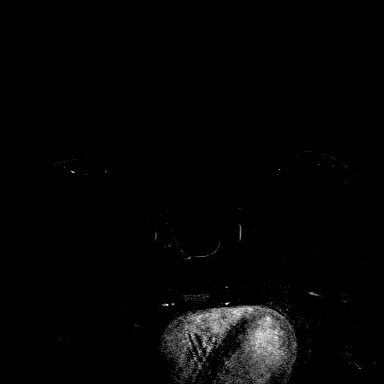
[im 132/176]
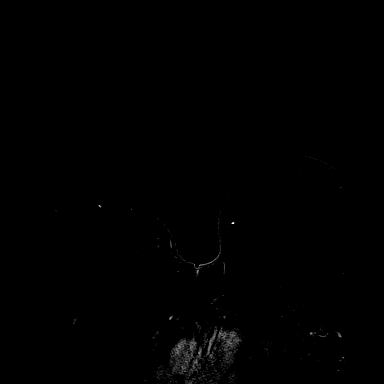
[im 176/176]
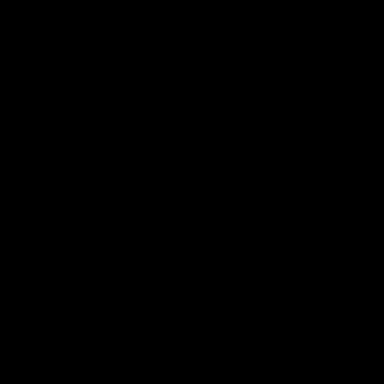

[Series 7: fl3d post-cm 20 · axial · 211.2mm · 1.02mm/px · 1 of 1 slices shown (3 of 3)]
[im 1/1]
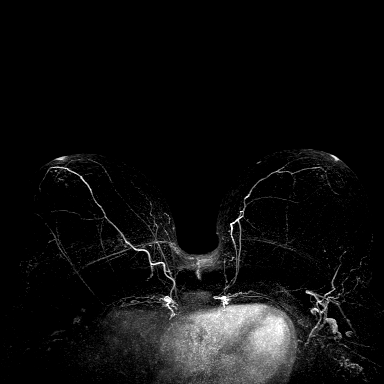

[Series 8: fl3d post-cm 3min · axial · 1.2mm · 1.02mm/px · z∈[-102,+108]mm · 6 of 176 slices shown]
[im 1/176]
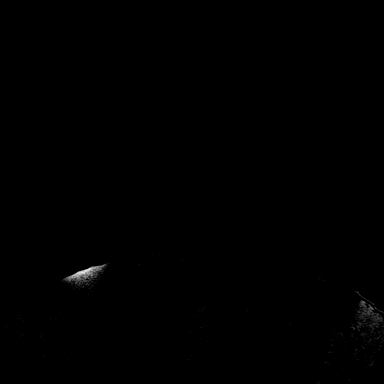
[im 36/176]
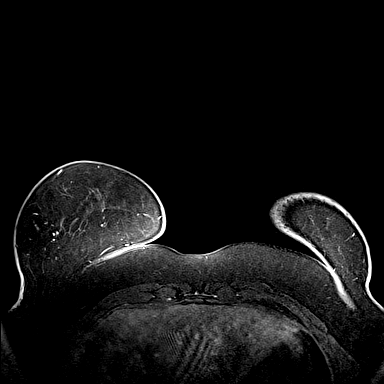
[im 71/176]
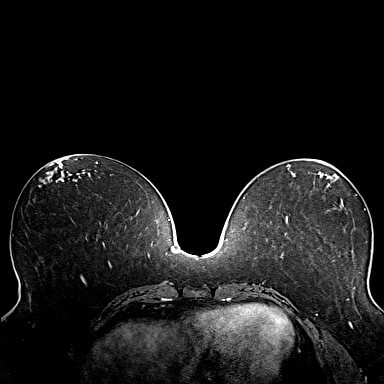
[im 106/176]
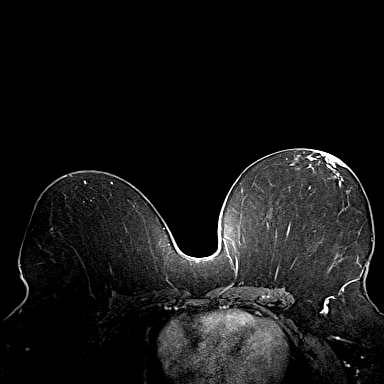
[im 141/176]
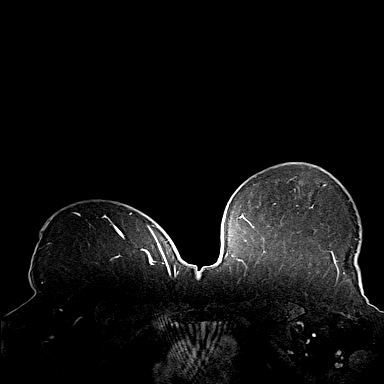
[im 176/176]
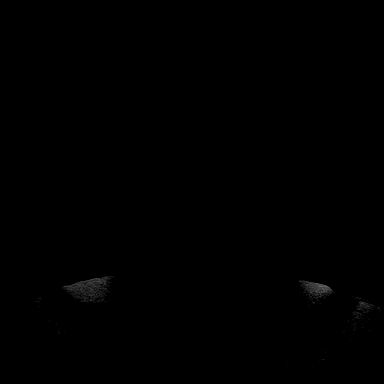

[Series 9: fl3d post-cm 3min_sub · axial · 1.2mm · 1.02mm/px · z∈[-102,+66]mm · 5 of 176 slices shown]
[im 1/176]
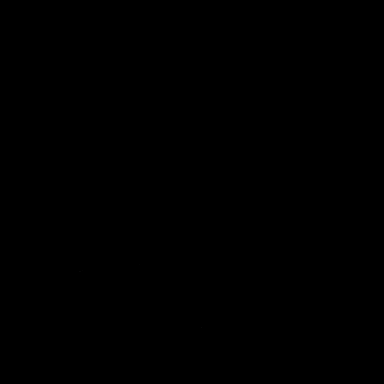
[im 36/176]
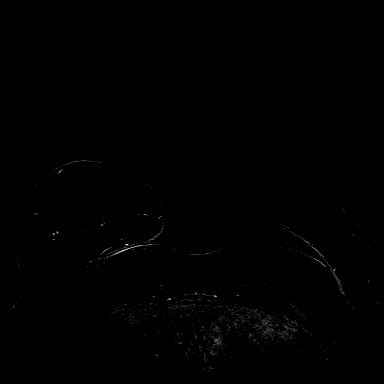
[im 71/176]
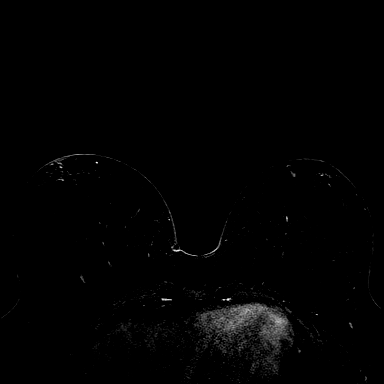
[im 106/176]
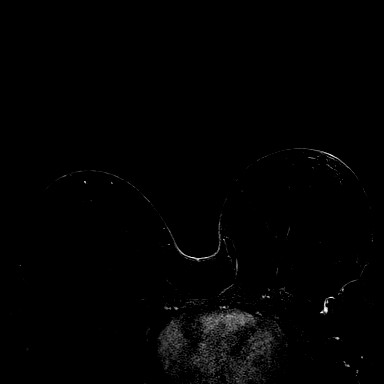
[im 141/176]
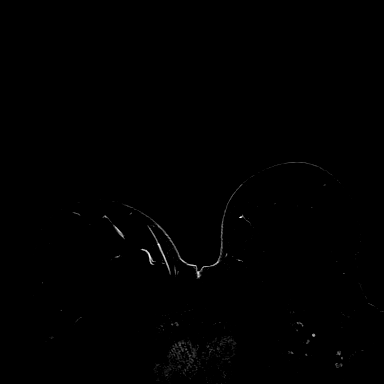

[33 of 48 positions shown; findings below may reference images not displayed]

Three-dimensional MR images were rendered by post-processing of the
original MR data on an independent workstation. The
three-dimensional MR images were interpreted, and findings are
reported in the following complete MRI report for this study. Three
dimensional images were evaluated at the independent interpreting
workstation using the DynaCAD thin client.
FINDINGS: Breast composition: a. Almost entirely fat.

Background parenchymal enhancement: Minimal

Right breast: No mass or abnormal enhancement.

Left breast: No mass or abnormal enhancement.

Lymph nodes: No abnormal appearing lymph nodes.

Ancillary findings:  None.
IMPRESSION: No MRI evidence of malignancy in either breast.

RECOMMENDATION:
1.  Continue annual screening MRI.

2.  Continue annual screening breast MRI.

The American Cancer Society recommends annual MRI and mammography in
patients with an estimated lifetime risk of developing breast cancer
greater than 20 - 25%, or who are known or suspected to be positive
for the breast cancer gene.

BI-RADS CATEGORY  1: Negative.

## 2022-09-28 ENCOUNTER — Other Ambulatory Visit: Payer: Self-pay | Admitting: Obstetrics and Gynecology

## 2022-09-28 DIAGNOSIS — Z800 Family history of malignant neoplasm of digestive organs: Secondary | ICD-10-CM

## 2022-12-24 ENCOUNTER — Other Ambulatory Visit: Payer: Commercial Managed Care - PPO

## 2023-10-04 ENCOUNTER — Other Ambulatory Visit: Payer: Self-pay | Admitting: Obstetrics and Gynecology

## 2023-10-04 DIAGNOSIS — Z800 Family history of malignant neoplasm of digestive organs: Secondary | ICD-10-CM

## 2023-11-02 ENCOUNTER — Other Ambulatory Visit: Payer: BC Managed Care – PPO

## 2024-10-04 ENCOUNTER — Other Ambulatory Visit: Payer: Self-pay | Admitting: Obstetrics and Gynecology

## 2024-10-04 DIAGNOSIS — Z800 Family history of malignant neoplasm of digestive organs: Secondary | ICD-10-CM

## 2024-10-07 ENCOUNTER — Other Ambulatory Visit: Payer: Self-pay | Admitting: Obstetrics and Gynecology

## 2024-10-07 DIAGNOSIS — Z803 Family history of malignant neoplasm of breast: Secondary | ICD-10-CM

## 2024-10-23 ENCOUNTER — Encounter: Payer: Self-pay | Admitting: Obstetrics and Gynecology

## 2024-11-09 ENCOUNTER — Inpatient Hospital Stay: Admission: RE | Admit: 2024-11-09

## 2024-11-12 ENCOUNTER — Other Ambulatory Visit
